# Patient Record
Sex: Female | Born: 1995 | Race: White | Hispanic: No | Marital: Single | Smoking: Never smoker
Health system: Southern US, Community
[De-identification: ages and names within clinical notes are randomized; demographics above are authoritative.]

## PROBLEM LIST (undated history)

## (undated) DIAGNOSIS — N809 Endometriosis, unspecified: Secondary | ICD-10-CM

## (undated) DIAGNOSIS — R87619 Unspecified abnormal cytological findings in specimens from cervix uteri: Secondary | ICD-10-CM

## (undated) DIAGNOSIS — K219 Gastro-esophageal reflux disease without esophagitis: Secondary | ICD-10-CM

## (undated) DIAGNOSIS — F32A Depression, unspecified: Secondary | ICD-10-CM

## (undated) DIAGNOSIS — F329 Major depressive disorder, single episode, unspecified: Secondary | ICD-10-CM

## (undated) HISTORY — PX: LAPAROSCOPY: SHX197

## (undated) HISTORY — DX: Major depressive disorder, single episode, unspecified: F32.9

## (undated) HISTORY — DX: Unspecified abnormal cytological findings in specimens from cervix uteri: R87.619

## (undated) HISTORY — DX: Depression, unspecified: F32.A

---

## 2014-02-10 ENCOUNTER — Emergency Department: Payer: Self-pay | Admitting: Emergency Medicine

## 2015-01-14 ENCOUNTER — Ambulatory Visit: Payer: Self-pay | Admitting: Family

## 2015-01-18 ENCOUNTER — Encounter: Payer: Self-pay | Admitting: Family Medicine

## 2015-01-18 DIAGNOSIS — F32A Depression, unspecified: Secondary | ICD-10-CM | POA: Insufficient documentation

## 2015-01-18 DIAGNOSIS — B349 Viral infection, unspecified: Principal | ICD-10-CM

## 2015-01-18 DIAGNOSIS — F329 Major depressive disorder, single episode, unspecified: Secondary | ICD-10-CM | POA: Insufficient documentation

## 2015-01-18 DIAGNOSIS — B2780 Other infectious mononucleosis without complication: Secondary | ICD-10-CM

## 2015-06-18 ENCOUNTER — Encounter: Payer: Self-pay | Admitting: *Deleted

## 2015-06-18 ENCOUNTER — Emergency Department: Payer: BLUE CROSS/BLUE SHIELD

## 2015-06-18 ENCOUNTER — Emergency Department
Admission: EM | Admit: 2015-06-18 | Discharge: 2015-06-18 | Disposition: A | Discharge status: Home / Self Care | Payer: BLUE CROSS/BLUE SHIELD | Attending: Emergency Medicine | Admitting: Emergency Medicine

## 2015-06-18 DIAGNOSIS — Z79899 Other long term (current) drug therapy: Secondary | ICD-10-CM | POA: Insufficient documentation

## 2015-06-18 DIAGNOSIS — Y9289 Other specified places as the place of occurrence of the external cause: Secondary | ICD-10-CM | POA: Insufficient documentation

## 2015-06-18 DIAGNOSIS — S0990XA Unspecified injury of head, initial encounter: Secondary | ICD-10-CM | POA: Diagnosis present

## 2015-06-18 DIAGNOSIS — W228XXA Striking against or struck by other objects, initial encounter: Secondary | ICD-10-CM | POA: Insufficient documentation

## 2015-06-18 DIAGNOSIS — S060X0A Concussion without loss of consciousness, initial encounter: Secondary | ICD-10-CM | POA: Diagnosis not present

## 2015-06-18 DIAGNOSIS — S0083XA Contusion of other part of head, initial encounter: Secondary | ICD-10-CM | POA: Insufficient documentation

## 2015-06-18 DIAGNOSIS — Y998 Other external cause status: Secondary | ICD-10-CM | POA: Insufficient documentation

## 2015-06-18 DIAGNOSIS — Z3202 Encounter for pregnancy test, result negative: Secondary | ICD-10-CM | POA: Insufficient documentation

## 2015-06-18 DIAGNOSIS — Y9389 Activity, other specified: Secondary | ICD-10-CM | POA: Insufficient documentation

## 2015-06-18 LAB — POCT PREGNANCY, URINE: Preg Test, Ur: NEGATIVE
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
## 2015-06-18 NOTE — Discharge Instructions (Signed)
Concussion A concussion, or closed-head injury, is a brain injury caused by a direct blow to the head or by a quick and sudden movement (jolt) of the head or neck. Concussions are usually not life-threatening. Even so, the effects of a concussion can be serious. If you have had a concussion before, you are more likely to experience concussion-like symptoms after a direct blow to the head.  CAUSES  Direct blow to the head, such as from running into another player during a soccer game, being hit in a fight, or hitting your head on a hard surface.  A jolt of the head or neck that causes the brain to move back and forth inside the skull, such as in a car crash. SIGNS AND SYMPTOMS The signs of a concussion can be hard to notice. Early on, they may be missed by you, family members, and health care providers. You may look fine but act or feel differently. Symptoms are usually temporary, but they may last for days, weeks, or even longer. Some symptoms may appear right away while others may not show up for hours or days. Every head injury is different. Symptoms include:  Mild to moderate headaches that will not go away.  A feeling of pressure inside your head.  Having more trouble than usual:  Learning or remembering things you have heard.  Answering questions.  Paying attention or concentrating.  Organizing daily tasks.  Making decisions and solving problems.  Slowness in thinking, acting or reacting, speaking, or reading.  Getting lost or being easily confused.  Feeling tired all the time or lacking energy (fatigued).  Feeling drowsy.  Sleep disturbances.  Sleeping more than usual.  Sleeping less than usual.  Trouble falling asleep.  Trouble sleeping (insomnia).  Loss of balance or feeling lightheaded or dizzy.  Nausea or vomiting.  Numbness or tingling.  Increased sensitivity to:  Sounds.  Lights.  Distractions.  Vision problems or eyes that tire  easily.  Diminished sense of taste or smell.  Ringing in the ears.  Mood changes such as feeling sad or anxious.  Becoming easily irritated or angry for little or no reason.  Lack of motivation.  Seeing or hearing things other people do not see or hear (hallucinations). DIAGNOSIS Your health care provider can usually diagnose a concussion based on a description of your injury and symptoms. He or she will ask whether you passed out (lost consciousness) and whether you are having trouble remembering events that happened right before and during your injury. Your evaluation might include:  A brain scan to look for signs of injury to the brain. Even if the test shows no injury, you may still have a concussion.  Blood tests to be sure other problems are not present. TREATMENT  Concussions are usually treated in an emergency department, in urgent care, or at a clinic. You may need to stay in the hospital overnight for further treatment.  Tell your health care provider if you are taking any medicines, including prescription medicines, over-the-counter medicines, and natural remedies. Some medicines, such as blood thinners (anticoagulants) and aspirin, may increase the chance of complications. Also tell your health care provider whether you have had alcohol or are taking illegal drugs. This information may affect treatment.  Your health care provider will send you home with important instructions to follow.  How fast you will recover from a concussion depends on many factors. These factors include how severe your concussion is, what part of your brain was injured, your  age, and how healthy you were before the concussion.  Most people with mild injuries recover fully. Recovery can take time. In general, recovery is slower in older persons. Also, persons who have had a concussion in the past or have other medical problems may find that it takes longer to recover from their current injury. HOME  CARE INSTRUCTIONS General Instructions  Carefully follow the directions your health care provider gave you.  Only take over-the-counter or prescription medicines for pain, discomfort, or fever as directed by your health care provider.  Take only those medicines that your health care provider has approved.  Do not drink alcohol until your health care provider says you are well enough to do so. Alcohol and certain other drugs may slow your recovery and can put you at risk of further injury.  If it is harder than usual to remember things, write them down.  If you are easily distracted, try to do one thing at a time. For example, do not try to watch TV while fixing dinner.  Talk with family members or close friends when making important decisions.  Keep all follow-up appointments. Repeated evaluation of your symptoms is recommended for your recovery.  Watch your symptoms and tell others to do the same. Complications sometimes occur after a concussion. Older adults with a brain injury may have a higher risk of serious complications, such as a blood clot on the brain.  Tell your teachers, school nurse, school counselor, coach, athletic trainer, or work Freight forwarder about your injury, symptoms, and restrictions. Tell them about what you can or cannot do. They should watch for:  Increased problems with attention or concentration.  Increased difficulty remembering or learning new information.  Increased time needed to complete tasks or assignments.  Increased irritability or decreased ability to cope with stress.  Increased symptoms.  Rest. Rest helps the brain to heal. Make sure you:  Get plenty of sleep at night. Avoid staying up late at night.  Keep the same bedtime hours on weekends and weekdays.  Rest during the day. Take daytime naps or rest breaks when you feel tired.  Limit activities that require a lot of thought or concentration. These include:  Doing homework or job-related  work.  Watching TV.  Working on the computer.  Avoid any situation where there is potential for another head injury (football, hockey, soccer, basketball, martial arts, downhill snow sports and horseback riding). Your condition will get worse every time you experience a concussion. You should avoid these activities until you are evaluated by the appropriate follow-up health care providers. Returning To Your Regular Activities You will need to return to your normal activities slowly, not all at once. You must give your body and brain enough time for recovery.  Do not return to sports or other athletic activities until your health care provider tells you it is safe to do so.  Ask your health care provider when you can drive, ride a bicycle, or operate heavy machinery. Your ability to react may be slower after a brain injury. Never do these activities if you are dizzy.  Ask your health care provider about when you can return to work or school. Preventing Another Concussion It is very important to avoid another brain injury, especially before you have recovered. In rare cases, another injury can lead to permanent brain damage, brain swelling, or death. The risk of this is greatest during the first 7-10 days after a head injury. Avoid injuries by:  Wearing a seat  belt when riding in a car.  Drinking alcohol only in moderation.  Wearing a helmet when biking, skiing, skateboarding, skating, or doing similar activities.  Avoiding activities that could lead to a second concussion, such as contact or recreational sports, until your health care provider says it is okay.  Taking safety measures in your home.  Remove clutter and tripping hazards from floors and stairways.  Use grab bars in bathrooms and handrails by stairs.  Place non-slip mats on floors and in bathtubs.  Improve lighting in dim areas. SEEK MEDICAL CARE IF:  You have increased problems paying attention or  concentrating.  You have increased difficulty remembering or learning new information.  You need more time to complete tasks or assignments than before.  You have increased irritability or decreased ability to cope with stress.  You have more symptoms than before. Seek medical care if you have any of the following symptoms for more than 2 weeks after your injury:  Lasting (chronic) headaches.  Dizziness or balance problems.  Nausea.  Vision problems.  Increased sensitivity to noise or light.  Depression or mood swings.  Anxiety or irritability.  Memory problems.  Difficulty concentrating or paying attention.  Sleep problems.  Feeling tired all the time. SEEK IMMEDIATE MEDICAL CARE IF:  You have severe or worsening headaches. These may be a sign of a blood clot in the brain.  You have weakness (even if only in one hand, leg, or part of the face).  You have numbness.  You have decreased coordination.  You vomit repeatedly.  You have increased sleepiness.  One pupil is larger than the other.  You have convulsions.  You have slurred speech.  You have increased confusion. This may be a sign of a blood clot in the brain.  You have increased restlessness, agitation, or irritability.  You are unable to recognize people or places.  You have neck pain.  It is difficult to wake you up.  You have unusual behavior changes.  You lose consciousness. MAKE SURE YOU:  Understand these instructions.  Will watch your condition.  Will get help right away if you are not doing well or get worse. Document Released: 11/28/2003 Document Revised: 09/12/2013 Document Reviewed: 03/30/2013 [facility] Patient Information 2015 [place], [place]. This information is not intended to replace advice given to you by your health care provider. Make sure you discuss any questions you have with your health care provider.  Concussion Direct trauma to the head often causes a condition  known as a concussion. This injury can temporarily interfere with brain function and may cause you to pass out (lose consciousness). The consequences of a concussion are usually short-term, but repetitive concussions can be very dangerous. If you have multiple concussions, you will have a greater risk of long-term effects, such as slurred speech, slow movements, impaired thinking, or tremors. The severity of a concussion is based on the length and severity of the interference with brain activity. SYMPTOMS  Symptoms of a concussion vary depending on the severity of the injury. Very mild concussions may even occur without any noticeable symptoms. Swelling in the area of the injury is not related to the seriousness of the injury.   Mild concussion:  Temporary loss of consciousness may or may not occur.  Memory loss (amnesia) for a short time.  Emotional instability.  Confusion.  Severe concussion:  Usually prolonged loss of consciousness.  Confusion  One pupil (the black part in the middle of the eye) is larger than  the other.  Changes in vision (including blurring).  Changes in breathing.  Disturbed balance (equilibrium).  Headaches.  Confusion.  Nausea or vomiting.  Slower reaction time than normal.  Difficulty learning and remembering things you have heard. CAUSES  A concussion is the result of trauma to the head. When the head is subjected to such an injury, the brain strikes against the inner wall of the skull. This impact is what causes the damage to the brain. The force of injury is related to severity of injury. The most severe concussions are associated with incidents that involve large impact forces such as motor vehicle accidents. Wearing a helmet will reduce the severity of trauma to the head, but concussions may still occur if you are wearing a helmet. RISK INCREASES WITH:  Contact sports (football, hockey, soccer, rugby, basketball or lacrosse).  Fighting sports  (martial arts or boxing).  Riding bicycles, motorcycles, or horses (when you ride without a helmet). PREVENTION  Wear proper protective headgear and ensure correct fit.  Wear seat belts when driving and riding in a car.  Do not drink or use mind-altering drugs and drive. PROGNOSIS  Concussions are typically curable if they are recognized and treated early. If a severe concussion or multiple concussions go untreated, then the complications may be life-threatening or cause permanent disability and brain damage. RELATED COMPLICATIONS   Permanent brain damage (slurred speech, slow movement, impaired thinking, or tremors).  Bleeding under the skull (subdural hemorrhage or hematoma, epidural hematoma).  Bleeding into the brain.  Prolonged healing time if usual activities are resumed too soon.  Infection if skin over the concussion site is broken.  Increased risk of future concussions (less trauma is required for a second concussion than the first). TREATMENT  Treatment initially requires immediate evaluation to determine the severity of the concussion. Occasionally, a hospital stay may be required for observation and treatment.  Avoid exertion. Bed rest for the first 24-48 hours is recommended.  Return to play is a controversial subject due to the increased risk for future injury as well as permanent disability and should be discussed at length with your treating caregiver. Many factors such as the severity of the concussion and whether this is the first, second, or third concussion play a role in timing a patient's return to sports.  MEDICATION  Do not give any medicine, including non-prescription acetaminophen or aspirin, until the diagnosis is certain. These medicines may mask developing symptoms.  SEEK IMMEDIATE MEDICAL CARE IF:   Symptoms get worse or do not improve in 24 hours.  Any of the following symptoms occur:  Vomiting.  The inability to move arms and legs equally well on  both sides.  Fever.  Neck stiffness.  Pupils of unequal size, shape, or reactivity.  Convulsions.  Noticeable restlessness.  Severe headache that persists for longer than 4 hours after injury.  Confusion, disorientation, or mental status changes. Document Released: 09/07/2005 Document Revised: 06/28/2013 Document Reviewed: 12/20/2008 [facility] Patient Information 2015 [place], [place]. This information is not intended to replace advice given to you by your health care provider. Make sure you discuss any questions you have with your health care provider.  Please return immediately if condition worsens. Please contact her primary physician or the physician you were given for referral. If you have any specialist physicians involved in her treatment and plan please also contact them. Thank you for using [place] [facility] regional emergency Department.

## 2015-10-25 ENCOUNTER — Encounter: Payer: Self-pay | Admitting: Emergency Medicine

## 2015-10-25 ENCOUNTER — Emergency Department
Admission: EM | Admit: 2015-10-25 | Discharge: 2015-10-25 | Disposition: A | Discharge status: Home / Self Care | Payer: BLUE CROSS/BLUE SHIELD | Attending: Emergency Medicine | Admitting: Emergency Medicine

## 2015-10-25 ENCOUNTER — Emergency Department: Payer: BLUE CROSS/BLUE SHIELD

## 2015-10-25 DIAGNOSIS — R1013 Epigastric pain: Secondary | ICD-10-CM | POA: Insufficient documentation

## 2015-10-25 DIAGNOSIS — R112 Nausea with vomiting, unspecified: Secondary | ICD-10-CM | POA: Diagnosis present

## 2015-10-25 DIAGNOSIS — Z880 Allergy status to penicillin: Secondary | ICD-10-CM | POA: Diagnosis not present

## 2015-10-25 DIAGNOSIS — R197 Diarrhea, unspecified: Secondary | ICD-10-CM | POA: Insufficient documentation

## 2015-10-25 DIAGNOSIS — R109 Unspecified abdominal pain: Secondary | ICD-10-CM

## 2015-10-25 DIAGNOSIS — Z3202 Encounter for pregnancy test, result negative: Secondary | ICD-10-CM | POA: Diagnosis not present

## 2015-10-25 DIAGNOSIS — Z79899 Other long term (current) drug therapy: Secondary | ICD-10-CM | POA: Diagnosis not present

## 2015-10-25 HISTORY — DX: Gastro-esophageal reflux disease without esophagitis: K21.9

## 2015-10-25 LAB — CBC WITH DIFFERENTIAL/PLATELET
BASOS ABS: 0 10*3/uL (ref 0–0.1)
BASOS PCT: 1 %
Eosinophils Absolute: 0.1 10*3/uL (ref 0–0.7)
Eosinophils Relative: 2 %
HEMATOCRIT: 40.5 % (ref 35.0–47.0)
HEMOGLOBIN: 13.8 g/dL (ref 12.0–16.0)
Lymphocytes Relative: 41 %
Lymphs Abs: 1.9 10*3/uL (ref 1.0–3.6)
MCH: 29.4 pg (ref 26.0–34.0)
MCHC: 34 g/dL (ref 32.0–36.0)
MCV: 86.6 fL (ref 80.0–100.0)
Monocytes Absolute: 0.4 10*3/uL (ref 0.2–0.9)
Monocytes Relative: 8 %
NEUTROS ABS: 2.3 10*3/uL (ref 1.4–6.5)
NEUTROS PCT: 48 %
Platelets: 266 10*3/uL (ref 150–440)
RBC: 4.67 MIL/uL (ref 3.80–5.20)
RDW: 12 % (ref 11.5–14.5)
WBC: 4.7 10*3/uL (ref 3.6–11.0)

## 2015-10-25 LAB — URINALYSIS COMPLETE WITH MICROSCOPIC (ARMC ONLY)
Bilirubin Urine: NEGATIVE
GLUCOSE, UA: NEGATIVE mg/dL
Hgb urine dipstick: NEGATIVE
Leukocytes, UA: NEGATIVE
NITRITE: NEGATIVE
PROTEIN: NEGATIVE mg/dL
SPECIFIC GRAVITY, URINE: 1.013 (ref 1.005–1.030)
pH: 6 (ref 5.0–8.0)

## 2015-10-25 LAB — HEPATIC FUNCTION PANEL
ALK PHOS: 35 U/L — AB (ref 38–126)
ALT: 11 U/L — AB (ref 14–54)
AST: 19 U/L (ref 15–41)
Albumin: 4.7 g/dL (ref 3.5–5.0)
BILIRUBIN DIRECT: 0.1 mg/dL (ref 0.1–0.5)
BILIRUBIN INDIRECT: 1 mg/dL — AB (ref 0.3–0.9)
BILIRUBIN TOTAL: 1.1 mg/dL (ref 0.3–1.2)
Total Protein: 7.7 g/dL (ref 6.5–8.1)

## 2015-10-25 LAB — POCT PREGNANCY, URINE: Preg Test, Ur: NEGATIVE

## 2015-10-25 LAB — BASIC METABOLIC PANEL
ANION GAP: 8 (ref 5–15)
BUN: 18 mg/dL (ref 6–20)
CALCIUM: 9.6 mg/dL (ref 8.9–10.3)
CO2: 25 mmol/L (ref 22–32)
Chloride: 105 mmol/L (ref 101–111)
Creatinine, Ser: 0.68 mg/dL (ref 0.44–1.00)
GFR calc non Af Amer: >60 mL/min (ref >60)
Glucose, Bld: 87 mg/dL (ref 65–99)
Potassium: 4.2 mmol/L (ref 3.5–5.1)
Sodium: 138 mmol/L (ref 135–145)

## 2015-10-25 LAB — LIPASE, BLOOD: Lipase: 24 U/L (ref 11–51)

## 2015-10-25 MED ORDER — ONDANSETRON 4 MG PO TBDP: 4.0000 mg | ORAL_TABLET | Freq: Three times a day (TID) | ORAL | Status: DC | PRN

## 2015-10-25 MED ORDER — PROMETHAZINE HCL 50 MG PO TABS: 25.0000 mg | ORAL_TABLET | Freq: Four times a day (QID) | ORAL | Status: DC | PRN

## 2015-10-25 MED ORDER — SODIUM CHLORIDE 0.9 % IV BOLUS (SEPSIS)
1000.0000 mL | Freq: Once | INTRAVENOUS | Status: AC
  Administered 2015-10-25: 1000 mL via INTRAVENOUS

## 2015-10-25 MED ORDER — SODIUM CHLORIDE 0.9 % IV BOLUS (SEPSIS): 1000.0000 mL | Freq: Once | INTRAVENOUS | Status: DC

## 2015-10-25 MED ORDER — GI COCKTAIL ~~LOC~~
30.0000 mL | Freq: Once | ORAL | Status: AC
  Administered 2015-10-25: 30 mL via ORAL
  Filled 2015-10-25: qty 30

## 2015-10-25 MED ORDER — FAMOTIDINE 20 MG PO TABS: 20.0000 mg | ORAL_TABLET | Freq: Two times a day (BID) | ORAL | Status: DC

## 2015-10-25 NOTE — ED Notes (Signed)
Pt able to ambulate independently and without signs of difficulty. Pt appears to be in no acute distress at time of d/c and verbalized complete understanding of d/c instructions.

## 2015-10-25 NOTE — ED Notes (Signed)
Pt states she has been unable to keep anything down for 4 days now, vomiting and severe reflux. States she was sent here from Dr office for rehydration.

## 2015-10-25 NOTE — Discharge Instructions (Signed)

## 2015-10-25 NOTE — ED Provider Notes (Addendum)
[facility] Emergency Department Provider Note  ____________________________________________   I have reviewed the triage vital signs and the nursing notes.   HISTORY  Chief Complaint Emesis; Dehydration; and Gastroesophageal Reflux    HPI Jeanne Merritt is a 20 y.o. female who presents today complaining of vomiting and diarrhea. Patient states her diarrhea is getting better but her vomiting persists. Patient has a history of significant acid indigestion. She denies any past surgical history, she denies any recent sexual activity that could make her pregnant. She is with a female partner she states. She denies any fever or chills. She has had epigastric abdominal pain for months she states which is not currently present but seems to get better when she takes Nexium however, she states, taking Nexium did not seem to go well with her Lexapro. She can expect a splint why this is. She has therefore switch to Zantac which seems to be helping. Patient does have an appointment with a GI doctor in a few days. She denies any hematemesis she denies any melena or bright red blood per rectum. She states her last vomiting was 2 days ago. She feels somewhat dehydrated.  Past Medical History  Diagnosis Date  . GERD (gastroesophageal reflux disease)     Patient Active Problem List   Diagnosis Date Noted  . Infectious mononucleosis-like syndrome 01/18/2015  . Depression 01/18/2015    History reviewed. No pertinent past surgical history.  Current Outpatient Rx  Name  Route  Sig  Dispense  Refill  . escitalopram (LEXAPRO) 5 MG tablet   Oral   Take 10 mg by mouth daily.           Allergies Amoxicillin  No family history on file.  Social History Social History  Substance Use Topics  . Smoking status: Never Smoker   . Smokeless tobacco: None  . Alcohol Use: Yes    Review of Systems Constitutional: No fever/chills Eyes: No visual changes. ENT: No  sore throat. No stiff neck no neck pain Cardiovascular: Denies chest pain. Respiratory: Denies shortness of breath. Gastrointestinal:   See history of present illness Genitourinary: Negative for dysuria. Musculoskeletal: Negative lower extremity swelling Skin: Negative for rash. Neurological: Negative for headaches, focal weakness or numbness. 10-point ROS otherwise negative.  ____________________________________________   PHYSICAL EXAM:  VITAL SIGNS: ED Triage Vitals  Enc Vitals Group     BP 10/25/15 0927 118/77 mmHg     Pulse Rate 10/25/15 0927 76     Resp 10/25/15 0927 18     Temp 10/25/15 0927 98.2 F (36.8 C)     Temp Source 10/25/15 0927 Oral     SpO2 10/25/15 0927 98 %     Weight 10/25/15 0927 104 lb (47.174 kg)     Height 10/25/15 0927 5\' 1"  (1.549 m)     Head Cir --      Peak Flow --      Pain Score 10/25/15 0928 6     Pain Loc --      Pain Edu? --      Excl. in [place]? --     Constitutional: Alert and oriented. Well appearing and in no acute distress., Very well-appearing Eyes: Conjunctivae are normal. PERRL. EOMI. Head: Atraumatic. Nose: No congestion/rhinnorhea. Mouth/Throat: Mucous membranes are moist.  Oropharynx non-erythematous. Neck: No stridor.   Nontender with no meningismus Cardiovascular: Normal rate, regular rhythm. Grossly normal heart sounds.  Good peripheral circulation. Respiratory: Normal respiratory effort.  No retractions. Lungs CTAB. Abdominal:  Soft and nontender. No distention. No guarding no rebound Back:  There is no focal tenderness or step off there is no midline tenderness there are no lesions noted. there is no CVA tenderness Musculoskeletal: No lower extremity tenderness. No joint effusions, no DVT signs strong distal pulses no edema Neurologic:  Normal speech and language. No gross focal neurologic deficits are appreciated.  Skin:  Skin is warm, dry and intact. No rash noted. Psychiatric: Mood and affect are normal. Speech and  behavior are normal.  ____________________________________________   LABS (all labs ordered are listed, but only abnormal results are displayed)  Labs Reviewed  CBC WITH DIFFERENTIAL/PLATELET  BASIC METABOLIC PANEL  LIPASE, BLOOD  URINALYSIS COMPLETEWITH MICROSCOPIC ([place])  POC URINE PREG, ED   ____________________________________________  EKG  I personally interpreted any EKGs ordered by me or triage  ____________________________________________  RADIOLOGY  I reviewed any imaging ordered by me or triage that were performed during my shift ____________________________________________   PROCEDURES  Procedure(s) performed: None  Critical Care performed: None  ____________________________________________   INITIAL IMPRESSION / ASSESSMENT AND PLAN / ED COURSE  Pertinent labs & imaging results that were available during my care of the patient were reviewed by me and considered in my medical decision making (see chart for details).  No evidence at this time of any acute intra-abdominal pathology requiring surgery or imaging. Abdomen is completely benign. We will check basic blood work and reassess. We'll give her IV fluids as she is not markedly dehydrated clinically she states that she has that she is and she was sent here for IV fluid. Vital signs are reassuring.  ----------------------------------------- 11:14 AM on 10/25/2015 ----------------------------------------- ' Discuss with her primary care physician. They're concerned because patient has been having epigastric abdominal pain for about a month now with vomiting episodes. No bleeding. I have explained to her that this is certainly a good candidate for outpatient GI follow-up but thus far we have not seen any abnormal illness which mandate admission. Abdomen is benign. Because of this recurrent symptoms, we will see if we can do light upper quadrant ultrasound to rule out gallbladder disease as that is,  barring endoscopy which I do not think is emergently indicated, the only other test I can do to help further this workup along assuming normal liver fraction tests which are pending.  ----------------------------------------- 12:23 PM on 10/25/2015 -----------------------------------------  Patient has had no nausea or vomiting here. No clinical or blood work abnormalities suggest that the patient is dehydrated. Specifically there is no evidence of ketosis or elevated creatinine despite symptoms off and on for several weeks. Patient does have close outpatient follow-up but I do think she is safe for discharge to follow up there. She is tolerating by mouth here with no difficulty [place] far. Serial abdominal exams are benign and as stated I did because of her concern obtain an ultrasound of the right upper quadrant which is as expected negative.  ----------------------------------------- 1:04 PM on 10/25/2015 -----------------------------------------  Discharge patient is laughing and joking with her friend in no acute distress abdomen remains benign. No vomiting or diarrhea here. ____________________________________________   FINAL CLINICAL IMPRESSION(S) / ED DIAGNOSES  Final diagnoses:  None      This chart was dictated using voice recognition software.  Despite best efforts to proofread,  errors can occur which can change meaning.     Schuyler Amor, MD 10/25/15 [place], MD 10/25/15 [place], MD 10/25/15 [place]  Leone Haven, MD 10/25/15 1304

## 2015-11-12 ENCOUNTER — Encounter: Payer: Self-pay | Admitting: Obstetrics and Gynecology

## 2015-11-20 ENCOUNTER — Other Ambulatory Visit: Payer: Self-pay | Admitting: Gastroenterology

## 2015-11-20 DIAGNOSIS — R1013 Epigastric pain: Secondary | ICD-10-CM

## 2015-11-21 ENCOUNTER — Other Ambulatory Visit: Payer: Self-pay | Admitting: Gastroenterology

## 2015-11-21 ENCOUNTER — Ambulatory Visit
Admission: RE | Admit: 2015-11-21 | Discharge: 2015-11-21 | Disposition: A | Discharge status: Home / Self Care | Payer: BLUE CROSS/BLUE SHIELD | Source: Ambulatory Visit | Attending: Gastroenterology | Admitting: Gastroenterology

## 2015-11-21 DIAGNOSIS — K219 Gastro-esophageal reflux disease without esophagitis: Secondary | ICD-10-CM | POA: Insufficient documentation

## 2015-11-21 DIAGNOSIS — R634 Abnormal weight loss: Secondary | ICD-10-CM

## 2015-11-21 DIAGNOSIS — K30 Functional dyspepsia: Secondary | ICD-10-CM | POA: Insufficient documentation

## 2015-11-21 DIAGNOSIS — R112 Nausea with vomiting, unspecified: Secondary | ICD-10-CM | POA: Insufficient documentation

## 2015-11-21 DIAGNOSIS — R1013 Epigastric pain: Secondary | ICD-10-CM

## 2015-11-21 MED ORDER — TECHNETIUM TC 99M SULFUR COLLOID
2.0000 | Freq: Once | INTRAVENOUS | Status: AC | PRN
  Administered 2015-11-21: 2.09 via INTRAVENOUS

## 2015-11-27 ENCOUNTER — Ambulatory Visit (INDEPENDENT_AMBULATORY_CARE_PROVIDER_SITE_OTHER): Payer: BLUE CROSS/BLUE SHIELD | Admitting: Obstetrics and Gynecology

## 2015-11-27 ENCOUNTER — Encounter: Payer: Self-pay | Admitting: Obstetrics and Gynecology

## 2015-11-27 VITALS — BP 121/81 | HR 79 | Ht 61.0 in | Wt 102.6 lb

## 2015-11-27 DIAGNOSIS — N926 Irregular menstruation, unspecified: Secondary | ICD-10-CM | POA: Diagnosis not present

## 2015-11-27 DIAGNOSIS — R102 Pelvic and perineal pain: Secondary | ICD-10-CM

## 2015-11-27 DIAGNOSIS — N939 Abnormal uterine and vaginal bleeding, unspecified: Secondary | ICD-10-CM | POA: Diagnosis not present

## 2015-11-27 LAB — POCT URINE PREGNANCY: PREG TEST UR: NEGATIVE

## 2015-11-27 NOTE — Patient Instructions (Signed)
1. Ultrasound will be scheduled for evaluation of pain and abnormal uterine bleeding 2. Return in 1 week after ultrasound for further management planning 3. Literature is given regarding endometriosis  Endometriosis Endometriosis is a condition in which the tissue that lines the uterus (endometrium) grows outside of its normal location. The tissue may grow in many locations close to the uterus, but it commonly grows on the ovaries, fallopian tubes, vagina, or bowel. Because the uterus expels, or sheds, its lining every menstrual cycle, there is bleeding wherever the endometrial tissue is located. This can cause pain because blood is irritating to tissues not normally exposed to it.  CAUSES  The cause of endometriosis is not known.  SIGNS AND SYMPTOMS  Often, there are no symptoms. When symptoms are present, they can vary with the location of the displaced tissue. Various symptoms can occur at different times. Although symptoms occur mainly during a woman's menstrual period, they can also occur midcycle and usually stop with menopause. Some people may go months with no symptoms at all. Symptoms may include:   Back or abdominal pain.   Heavier bleeding during periods.   Pain during intercourse.   Painful bowel movements.   Infertility. DIAGNOSIS  Your health care provider will do a physical exam and ask about your symptoms. Various tests may be done, such as:   Blood tests and urine tests. These are done to help rule out other problems.   Ultrasound. This test is done to look for abnormal tissue.   An X-ray of the lower bowel (barium enema).  Laparoscopy. In this procedure, a thin, lighted tube with a tiny camera on the end (laparoscope) is inserted into your abdomen. This helps your health care provider look for abnormal tissue to confirm the diagnosis. The health care provider may also remove a small piece of tissue (biopsy) from any abnormal tissue found. This tissue sample can  then be sent to a lab so it can be looked at under a microscope. TREATMENT  Treatment will vary and may include:   Medicines to relieve pain. Nonsteroidal anti-inflammatory drugs (NSAIDs) are a type of pain medicine that can help to relieve the pain caused by endometriosis.  Hormonal therapy. When using hormonal therapy, periods are eliminated. This eliminates the monthly exposure to blood by the displaced endometrial tissue.   Surgery. Surgery may sometimes be done to remove the abnormal endometrial tissue. In severe cases, surgery may be done to remove the fallopian tubes, uterus, and ovaries (hysterectomy). HOME CARE INSTRUCTIONS   Take all medicines as directed by your health care provider. Do not take aspirin because it may increase bleeding when you are not on hormonal therapy.   Avoid activities that produce pain, including sexual activity. SEEK MEDICAL CARE IF:  You have pelvic pain before, after, or during your periods.  You have pelvic pain between periods that gets worse during your period.  You have pelvic pain during or after sex.  You have pelvic pain with bowel movements or urination, especially during your period.  You have problems getting pregnant.  You have a fever. SEEK IMMEDIATE MEDICAL CARE IF:   Your pain is severe and is not responding to pain medicine.   You have severe nausea and vomiting, or you cannot keep foods down.   You have pain that is limited to the right lower part of your abdomen.   You have swelling or increasing pain in your abdomen.   You see blood in your stool.  MAKE  SURE YOU:   Understand these instructions.  Will watch your condition.  Will get help right away if you are not doing well or get worse.   This information is not intended to replace advice given to you by your health care provider. Make sure you discuss any questions you have with your health care provider.   Document Released: 09/04/2000 Document Revised:  09/28/2014 Document Reviewed: 05/05/2013 Elsevier Interactive Patient Education [employer].

## 2015-11-27 NOTE — Progress Notes (Signed)
GYN ENCOUNTER NOTE  Subjective:       Jeanne Merritt is a 20 y.o. G0P0000 female is here for gynecologic evaluation of the following issues:  1. Abnormal uterine bleeding.    20 year old single white female, para 0, in same sex relationship, using nothing for contraception, presents for evaluation of long history of abnormal uterine bleeding. Menarche was age 53.  First cycle was extremely heavy and prolonged upwards of 1 month duration; one week of birth control pills was given with subsequent switching to Depo-Provera injection; Depo-Provera suppress the bleeding.  She continued to take Depo-Provera for approximately 1 year, missed an injection, and developed recurrent heavy prolonged bleeding.  Patient subsequently went back on Depo-Provera continuously until August 2016 when she took her last injection.  Since August 2016.  Patient has had one episode of spotting.  She is experiencing cramps as if she is going to start her menses.  Patient has long history of anxiety/depression, and since coming off the Depo-Provera in August 2016, her psychology profile has improved regarding moods.  The patient is not desiring to continue with Depo-Provera at this time.    Gynecologic History Patient's last menstrual period was 11/14/2015 (approximate). Contraception: None. Same-sex relationship. Menarche age 29. History of dysmenorrhea. Pelvic pain characterized as low back pain and milder central cramping Patient has had intercourse without history of deep thrusting dyspareunia. Patient's mother had history of dysmenorrhea with subsequent hysterectomy; unknown.  History of endometriosis First Pap smear abnormal; follow-up Pap normal  Obstetric History OB History  Gravida Para Term Preterm AB SAB TAB Ectopic Multiple Living  0 0 0 0 0 0 0 0 0 0         Past Medical History  Diagnosis Date  . GERD (gastroesophageal reflux disease)   . Depression   . Abnormal Pap smear of cervix     x 1     History reviewed. No pertinent past surgical history.  Current Outpatient Prescriptions on File Prior to Visit  Medication Sig Dispense Refill  . escitalopram (LEXAPRO) 5 MG tablet Take 10 mg by mouth daily.    . famotidine (PEPCID) 20 MG tablet Take 1 tablet (20 mg total) by mouth 2 (two) times daily. 60 tablet 1  . Multiple Vitamin (MULTIVITAMIN WITH MINERALS) TABS tablet Take 1 tablet by mouth daily.    . promethazine (PHENERGAN) 50 MG tablet Take 0.5 tablets (25 mg total) by mouth every 6 (six) hours as needed for nausea or vomiting. 15 tablet 0  . ranitidine (ZANTAC) 150 MG tablet Take 150 mg by mouth as needed for heartburn.     No current facility-administered medications on file prior to visit.    Allergies  Allergen Reactions  . Amoxicillin Rash    Social History   Social History  . Marital Status: Single    Spouse Name: N/A  . Number of Children: N/A  . Years of Education: N/A   Occupational History  . Not on file.   Social History Main Topics  . Smoking status: Never Smoker   . Smokeless tobacco: Not on file  . Alcohol Use: Yes     Comment: weekly  . Drug Use: No  . Sexual Activity: Yes    Birth Control/ Protection: None     Comment: female partner   Other Topics Concern  . Not on file   Social History Narrative    Family History  Problem Relation Age of Onset  . Melanoma Maternal Grandmother   . Breast  cancer Paternal Grandmother   . Ovarian cancer Neg Hx   . Colon cancer Neg Hx   . Diabetes Neg Hx   . Heart disease Neg Hx     The following portions of the patient's history were reviewed and updated as appropriate: allergies, current medications, past family history, past medical history, past social history, past surgical history and problem list.  Review of Systems Review of Systems - General ROS: negative for - chills, fatigue, fever, hot flashes, malaise or night sweats Hematological and Lymphatic ROS: negative for - bleeding problems  or swollen lymph nodes Gastrointestinal ROS: negative for - abdominal pain, blood in stools, change in bowel habits and nausea/vomiting Musculoskeletal ROS: negative for - joint pain, muscle pain or muscular weakness Genito-Urinary ROS: negative for - change in menstrual cycle, dysmenorrhea, dyspareunia, dysuria, genital discharge, genital ulcers, hematuria, incontinence, irregular/heavy menses, nocturia or pelvic painjj  Objective:   BP 121/81 mmHg  Pulse 79  Ht 5\' 1"  (1.549 m)  Wt 102 lb 9.6 oz (46.539 kg)  BMI 19.40 kg/m2  LMP 11/14/2015 (Approximate) CONSTITUTIONAL: Well-developed, well-nourished female in no acute distress.  HENT:  Normocephalic, atraumatic.  NECK: Normal range of motion, supple, no masses.  Normal thyroid.  SKIN: Skin is warm and dry. No rash noted. Not diaphoretic. No erythema. No pallor. [place]: Alert and oriented to person, place, and time. PSYCHIATRIC: Normal mood and affect. Normal behavior. Normal judgment and thought content. CARDIOVASCULAR:Not Examined RESPIRATORY: Not Examined BREASTS: Not Examined ABDOMEN: Soft, non distended; Non tender.  No Organomegaly. PELVIC:(Intolerant of 2 digit exam, single digit exam performed)  External Genitalia: Normal  BUS: Normal  Vagina: Normal  Cervix: Normal; 1/4.  Cervical motion tenderness with left to right motion  Uterus: Normal size, shape,consistency, mobile, Nontender  Adnexa: Nonpalpable; left Adnexa, 2/4 tender; right Adnexa, nontender  RV: Normal External exam  Bladder: Nontender MUSCULOSKELETAL: Normal range of motion. No tenderness.  No cyanosis, clubbing, or edema.     Assessment:   1. Irregular menses; No history of cyclic menses; long history of Depo-Provera use - POCT urine pregnancy - US Pelvis Complete; Future - US Transvaginal Non-OB; Future  2. Pelvic pain in female; Severe dysmenorrhea and pain during bleeding - US Pelvis Complete; Future - US Transvaginal Non-OB; Future  3.  Abnormal uterine bleeding (AUB) New onset bleeding 6 months after last Depo-Provera injection   4.  Anxiety/depression with mood improvement following discontinuation of Depo-Provera   Plan:   1.  Pelvic ultrasound. 2.  Return in 1 week for further management planning 3.  Consider Mirena IUD or birth control pills for hormonal regulation of menses versus observation 4.  Possible consideration of laparoscopy to assess for endometriosis, which can explain abnormal uterine bleeding as well as dysmenorrhea  A total of 30 minutes were spent face-to-face with the patient during the encounter with greater than 50% dealing with counseling and coordination of care.  Brayton Mars, MD  Note: This dictation was prepared with Dragon dictation along with smaller phrase technology. Any transcriptional errors that result from this process are unintentional.

## 2015-11-29 ENCOUNTER — Ambulatory Visit (INDEPENDENT_AMBULATORY_CARE_PROVIDER_SITE_OTHER): Payer: BLUE CROSS/BLUE SHIELD

## 2015-11-29 DIAGNOSIS — N926 Irregular menstruation, unspecified: Secondary | ICD-10-CM | POA: Diagnosis not present

## 2015-11-29 DIAGNOSIS — R102 Pelvic and perineal pain: Secondary | ICD-10-CM | POA: Diagnosis not present

## 2015-12-12 ENCOUNTER — Ambulatory Visit: Payer: BLUE CROSS/BLUE SHIELD | Admitting: Obstetrics and Gynecology

## 2015-12-18 ENCOUNTER — Ambulatory Visit (INDEPENDENT_AMBULATORY_CARE_PROVIDER_SITE_OTHER): Payer: BLUE CROSS/BLUE SHIELD | Admitting: Obstetrics and Gynecology

## 2015-12-18 ENCOUNTER — Encounter: Payer: Self-pay | Admitting: Obstetrics and Gynecology

## 2015-12-18 VITALS — BP 110/79 | HR 112 | Ht 61.0 in | Wt 105.1 lb

## 2015-12-18 DIAGNOSIS — N926 Irregular menstruation, unspecified: Secondary | ICD-10-CM | POA: Diagnosis not present

## 2015-12-18 DIAGNOSIS — N840 Polyp of corpus uteri: Secondary | ICD-10-CM | POA: Diagnosis not present

## 2015-12-18 DIAGNOSIS — N939 Abnormal uterine and vaginal bleeding, unspecified: Secondary | ICD-10-CM | POA: Insufficient documentation

## 2015-12-18 NOTE — Progress Notes (Signed)
Chief complaint: 1. Abnormal uterine bleeding 2. Follow-up on pelvic ultrasound  Patient presents for follow-up. Recent ultrasound demonstrates possible endometrial polyp within the endometrial cavity. Ovaries demonstrates features possibly consistent with PCO  Findings were reviewed. Abnormal uterine bleeding may be related to prolonged Depo-Provera effect, versus possible endometrial polyp or submucosal fibroid, versus possible polycystic ovary disease. Recommended treatment is combined oral contraceptives for cycle regulation and management of possible PCO. Such treatment may exacerbate patient's clinical depression. Patient is not willing to take the risk of combined oral contraceptive therapy at this time. Because the patient's bleeding is occurring on an every other day basis, alternative management is to perform ENDOSEE procedure; if polyp is identified, and proceed with hysteroscopy/D&C with polypectomy. Patient is in agreement with this management and will be scheduled for the office procedure.  ASSESSMENT: 1. Abnormal uterine bleeding 2. Possible endometrial polyp on ultrasound 3. OCP therapy management is declined due to possible impact on clinical depression  PLAN: 1. Return for ENDOSEE procedure in the office  A total of 15 minutes were spent face-to-face with the patient during this encounter and over half of that time dealt with counseling and coordination of care.  Brayton Mars, MD  Note: This dictation was prepared with Dragon dictation along with smaller phrase technology. Any transcriptional errors that result from this process are unintentional.

## 2015-12-18 NOTE — Patient Instructions (Signed)
1. In office procedure will be set up for 1-2 weeks from the-ENDOSEE 2. Take 600 mg of Advil before the procedure

## 2015-12-30 ENCOUNTER — Ambulatory Visit (INDEPENDENT_AMBULATORY_CARE_PROVIDER_SITE_OTHER): Payer: BLUE CROSS/BLUE SHIELD | Admitting: Obstetrics and Gynecology

## 2015-12-30 ENCOUNTER — Encounter: Payer: Self-pay | Admitting: Obstetrics and Gynecology

## 2015-12-30 VITALS — BP 115/83 | HR 94 | Ht 61.0 in | Wt 105.6 lb

## 2015-12-30 DIAGNOSIS — D250 Submucous leiomyoma of uterus: Secondary | ICD-10-CM

## 2015-12-30 DIAGNOSIS — N939 Abnormal uterine and vaginal bleeding, unspecified: Secondary | ICD-10-CM | POA: Diagnosis not present

## 2015-12-30 NOTE — Patient Instructions (Signed)
Uterine Fibroids Uterine fibroids are tissue masses (tumors) that can develop in the womb (uterus). They are also called leiomyomas. This type of tumor is not cancerous (benign) and does not spread to other parts of the body outside of the pelvic area, which is between the hip bones. Occasionally, fibroids may develop in the fallopian tubes, in the cervix, or on the support structures (ligaments) that surround the uterus. You can have one or many fibroids. Fibroids can vary in size, weight, and where they grow in the uterus. Some can become quite large. Most fibroids do not require medical treatment. CAUSES A fibroid can develop when a single uterine cell keeps growing (replicating). Most cells in the human body have a control mechanism that keeps them from replicating without control. SIGNS AND SYMPTOMS Symptoms may include:   Heavy bleeding during your period.  Bleeding or spotting between periods.  Pelvic pain and pressure.  Bladder problems, such as needing to urinate more often (urinary frequency) or urgently.  Inability to reproduce offspring (infertility).  Miscarriages. DIAGNOSIS Uterine fibroids are diagnosed through a physical exam. Your health care provider may feel the lumpy tumors during a pelvic exam. Ultrasonography and an MRI may be done to determine the size, location, and number of fibroids. TREATMENT Treatment may include:  Watchful waiting. This involves getting the fibroid checked by your health care provider to see if it grows or shrinks. Follow your health care provider's recommendations for how often to have this checked.  Hormone medicines. These can be taken by mouth or given through an intrauterine device (IUD).  Surgery.  Removing the fibroids (myomectomy) or the uterus (hysterectomy).  Removing blood supply to the fibroids (uterine artery embolization). If fibroids interfere with your fertility and you want to become pregnant, your health care provider  may recommend having the fibroids removed.  HOME CARE INSTRUCTIONS  Keep all follow-up visits as directed by your health care provider. This is important.  Take medicines only as directed by your health care provider.  If you were prescribed a hormone treatment, take the hormone medicines exactly as directed.  Do not take aspirin, because it can cause bleeding.  Ask your health care provider about taking iron pills and increasing the amount of dark green, leafy vegetables in your diet. These actions can help to boost your blood iron levels, which may be affected by heavy menstrual bleeding.  Pay close attention to your period and tell your health care provider about any changes, such as:  Increased blood flow that requires you to use more pads or tampons than usual per month.  A change in the number of days that your period lasts per month.  A change in symptoms that are associated with your period, such as abdominal cramping or back pain. SEEK MEDICAL CARE IF:  You have pelvic pain, back pain, or abdominal cramps that cannot be controlled with medicines.  You have an increase in bleeding between and during periods.  You soak tampons or pads in a half hour or less.  You feel lightheaded, extra tired, or weak. SEEK IMMEDIATE MEDICAL CARE IF:  You faint.  You have a sudden increase in pelvic pain.   This information is not intended to replace advice given to you by your health care provider. Make sure you discuss any questions you have with your health care provider.   Document Released: 09/04/2000 Document Revised: 09/28/2014 Document Reviewed: 03/06/2014 Elsevier Interactive Patient Education 2016 [employer]  

## 2015-12-31 NOTE — Progress Notes (Signed)
Procedure note: ENDOSEE Indications: Abnormal uterine bleeding; hypervascular endometrium suspicious for possible polyps/fibroids noted on ultrasound Description: Patient is placed in the dorsal lithotomy position. Bimanual exam demonstrates midplane uterus of normal size and shape. Pearson speculum was placed to expose the cervix. The cervix is cleansed with Betadine. Under sterile technique the ENDOSEE instrument is placed through the endocervical canal; saline fluid is instilled through a 50 cc syringe per protocol. A posterior submucosal fibroid appears present in the mid and lower uterine segment. No other abnormal lesions are seen. Following photo documentation, the procedure is completed and ENDOSEE instrument was removed. Procedure was well-tolerated. Blood loss is minimal.  ASSESSMENT: 1. Abnormal uterine bleeding 2. Suspected submucosal fibroid on ENDOSEE procedure 3. History of clinical depression, possibly made worse with hormones (Depo-Provera) 4. Possible PCO 5. No need for endometrial sampling at this time  PLAN: 1. Menstrual calendar monitoring of bleeding 2. Return in 3 months for follow-up. If bleeding continues, may consider endometrial biopsy at that time  Brayton Mars, MD

## 2016-01-01 DIAGNOSIS — D250 Submucous leiomyoma of uterus: Secondary | ICD-10-CM | POA: Insufficient documentation

## 2016-01-15 NOTE — Addendum Note (Signed)
Addended by: Elouise Munroe on: 01/15/2016 01:40 PM   Modules accepted: Orders

## 2016-05-19 ENCOUNTER — Ambulatory Visit: Payer: BLUE CROSS/BLUE SHIELD | Admitting: Obstetrics and Gynecology

## 2016-07-01 ENCOUNTER — Emergency Department: Payer: BLUE CROSS/BLUE SHIELD

## 2016-07-01 ENCOUNTER — Emergency Department
Admission: EM | Admit: 2016-07-01 | Discharge: 2016-07-01 | Disposition: A | Discharge status: Home / Self Care | Payer: BLUE CROSS/BLUE SHIELD | Attending: Emergency Medicine | Admitting: Emergency Medicine

## 2016-07-01 DIAGNOSIS — G43909 Migraine, unspecified, not intractable, without status migrainosus: Secondary | ICD-10-CM | POA: Insufficient documentation

## 2016-07-01 LAB — CBC
HEMATOCRIT: 35.1 % (ref 35.0–47.0)
HEMOGLOBIN: 12.3 g/dL (ref 12.0–16.0)
MCH: 31 pg (ref 26.0–34.0)
MCHC: 35 g/dL (ref 32.0–36.0)
MCV: 88.5 fL (ref 80.0–100.0)
Platelets: 274 10*3/uL (ref 150–440)
RBC: 3.97 MIL/uL (ref 3.80–5.20)
RDW: 11.9 % (ref 11.5–14.5)
WBC: 5.7 10*3/uL (ref 3.6–11.0)

## 2016-07-01 LAB — BASIC METABOLIC PANEL
Anion gap: 7 (ref 5–15)
BUN: 14 mg/dL (ref 6–20)
CHLORIDE: 108 mmol/L (ref 101–111)
CO2: 23 mmol/L (ref 22–32)
Calcium: 8.7 mg/dL — ABNORMAL LOW (ref 8.9–10.3)
Creatinine, Ser: 0.65 mg/dL (ref 0.44–1.00)
GFR calc Af Amer: >60 mL/min (ref >60)
GFR calc non Af Amer: >60 mL/min (ref >60)
GLUCOSE: 111 mg/dL — AB (ref 65–99)
POTASSIUM: 3.6 mmol/L (ref 3.5–5.1)
Sodium: 138 mmol/L (ref 135–145)

## 2016-07-01 LAB — TROPONIN I: Troponin I: <0.03 ng/mL (ref <0.03)

## 2016-07-01 MED ORDER — KETOROLAC TROMETHAMINE 30 MG/ML IJ SOLN
30.0000 mg | Freq: Once | INTRAMUSCULAR | Status: AC
  Administered 2016-07-01: 30 mg via INTRAVENOUS
  Filled 2016-07-01: qty 1

## 2016-07-01 MED ORDER — METOCLOPRAMIDE HCL 5 MG/ML IJ SOLN
10.0000 mg | Freq: Once | INTRAMUSCULAR | Status: AC
  Administered 2016-07-01: 10 mg via INTRAVENOUS
  Filled 2016-07-01: qty 2

## 2016-07-01 MED ORDER — DIPHENHYDRAMINE HCL 50 MG/ML IJ SOLN
25.0000 mg | Freq: Once | INTRAMUSCULAR | Status: AC
  Administered 2016-07-01: 25 mg via INTRAVENOUS
  Filled 2016-07-01: qty 1

## 2016-07-01 MED ORDER — BUTALBITAL-APAP-CAFFEINE 50-325-40 MG PO TABS: 1.0000 | ORAL_TABLET | Freq: Four times a day (QID) | ORAL | 0 refills | Status: AC | PRN

## 2016-07-01 MED ORDER — SODIUM CHLORIDE 0.9 % IV BOLUS (SEPSIS)
1000.0000 mL | Freq: Once | INTRAVENOUS | Status: AC
  Administered 2016-07-01: 1000 mL via INTRAVENOUS

## 2016-07-01 NOTE — ED Notes (Signed)
Patient to ED for right sided headache. States she has a right sided migraine, history of having them in the past, that woke her up at midnight and was accompanied by vomiting. States she started her period about three or four days ago but headache is not usually associated with hormones.

## 2016-07-01 NOTE — ED Notes (Signed)
Patient tolerated IV insertion and medications well. Will continue monitor. Patient oriented to use of call bell.

## 2016-07-01 NOTE — ED Triage Notes (Signed)
Patient ambulatory to triage with steady gait, without difficulty or distress noted; pt reports right sided HA tonight accomp by N/V; st hx of same

## 2016-07-01 NOTE — ED Provider Notes (Signed)
[facility] Emergency Department Provider Note   ____________________________________________   First MD Initiated Contact with Patient 07/01/16 0301     (approximate)  I have reviewed the triage vital signs and the nursing notes.   HISTORY  Chief Complaint Migraine    HPI Jeanne Merritt is a 20 y.o. female who comes into the hospital today with a bad migraine, chest pain and vomiting. The patient reports she has had migraines before but this one is more intense. The patient took some Percocet and Flexeril because she thought it was due to muscle tension but she reports that the pain just got worse. She had some improvement with the Percocet but she still rates her pain 8 out of 10 in intensity. She reports that the pain is on the right side of her head and in the front of her head and behind her eye. She reports that the symptoms started at midnight. She woke up with the headache, vomiting and the chest discomfort. She is sensitive to light and sound. The patient reports that her chest feels tight and his little worse on the right versus the left. The patient reports she is on her period has not been eating very well as well. The patient is here for evaluation.   Past Medical History:  Diagnosis Date  . Abnormal Pap smear of cervix    x 1  . Depression   . GERD (gastroesophageal reflux disease)     Patient Active Problem List   Diagnosis Date Noted  . Fibroids, submucosal 01/01/2016  . Abnormal uterine bleeding 12/18/2015  . Abnormal uterine bleeding (AUB) 12/18/2015  . Infectious mononucleosis-like syndrome 01/18/2015  . Depression 01/18/2015    No past surgical history on file.  Prior to Admission medications   Medication Sig Start Date End Date Taking? Authorizing Provider  butalbital-acetaminophen-caffeine (FIORICET, ESGIC) 50-325-40 MG tablet Take 1-2 tablets by mouth every 6 (six) hours as needed for headache. 07/01/16 07/01/17  Loney Hering, MD  calcium elemental as carbonate (PX ANTACID MAXIMUM STRENGTH) 400 MG chewable tablet Chew by mouth.    Historical Provider, MD  Chlordiazepoxide-Clidinium (LIBRAX PO) Take by mouth.    Historical Provider, MD  escitalopram (LEXAPRO) 5 MG tablet Take 10 mg by mouth daily.    Historical Provider, MD  Multiple Vitamin (MULTIVITAMIN WITH MINERALS) TABS tablet Take 1 tablet by mouth daily.    Historical Provider, MD  pantoprazole (PROTONIX) 40 MG tablet Take by mouth. 10/29/15   Historical Provider, MD    Allergies Amoxicillin  Family History  Problem Relation Age of Onset  . Melanoma Maternal Grandmother   . Breast cancer Paternal Grandmother   . Ovarian cancer Neg Hx   . Colon cancer Neg Hx   . Diabetes Neg Hx   . Heart disease Neg Hx     Social History Social History  Substance Use Topics  . Smoking status: Never Smoker  . Smokeless tobacco: Not on file  . Alcohol use Yes     Comment: weekly    Review of Systems Constitutional: No fever/chills Eyes: No visual changes. ENT: No sore throat. Cardiovascular:  chest pain. Respiratory: Denies shortness of breath. Gastrointestinal: No abdominal pain.  No nausea, no vomiting.  No diarrhea.  No constipation. Genitourinary: Negative for dysuria. Musculoskeletal: Negative for back pain. Skin: Negative for rash. Neurological: Headache  10-point ROS otherwise negative.  ____________________________________________   PHYSICAL EXAM:  VITAL SIGNS: ED Triage Vitals  Enc Vitals Group  BP 07/01/16 0252 124/77     Pulse Rate 07/01/16 0252 88     Resp 07/01/16 0252 18     Temp 07/01/16 0252 97.5 F (36.4 C)     Temp Source 07/01/16 0252 Oral     SpO2 07/01/16 0252 97 %     Weight 07/01/16 0249 105 lb (47.6 kg)     Height 07/01/16 0249 5\' 1"  (1.549 m)     Head Circumference --      Peak Flow --      Pain Score 07/01/16 0249 8     Pain Loc --      Pain Edu? --      Excl. in [place]? --     Constitutional: Alert  and oriented. Well appearing and in no acute distress. Eyes: Conjunctivae are normal. PERRL. EOMI. Head: Atraumatic. Nose: No congestion/rhinnorhea. Mouth/Throat: Mucous membranes are moist.  Oropharynx non-erythematous. ardiovascular: Normal rate, regular rhythm. Grossly normal heart sounds.  Good peripheral circulation. Respiratory: Normal respiratory effort.  No retractions. Lungs CTAB. Gastrointestinal: Soft and nontender. No distention. Positive bowel sounds Musculoskeletal: No lower extremity tenderness nor edema.   Neurologic:  Normal speech and language. Cranial nerves II through XII are grossly intact with no focal motor or neuro deficits Skin:  Skin is warm, dry and intact.  Psychiatric: Mood and affect are normal.   ____________________________________________   LABS (all labs ordered are listed, but only abnormal results are displayed)  Labs Reviewed  BASIC METABOLIC PANEL - Abnormal; Notable for the following:       Result Value   Glucose, Bld 111 (*)    Calcium 8.7 (*)    All other components within normal limits  CBC  TROPONIN I   ____________________________________________  EKG  ED ECG REPORT I, Loney Hering, the attending physician, personally viewed and interpreted this ECG.   Date: 07/01/2016  EKG Time: 533  Rate: 68  Rhythm: normal sinus rhythm  Axis: normal  Intervals:none  ST&T Change: none  ____________________________________________  RADIOLOGY  CT head Chest x-ray ____________________________________________   PROCEDURES  Procedure(s) performed: None  Procedures  Critical Care performed: No  ____________________________________________   INITIAL IMPRESSION / ASSESSMENT AND PLAN / ED COURSE  Pertinent labs & imaging results that were available during my care of the patient were reviewed by me and considered in my medical decision making (see chart for details).  This is a 20 year old female who comes into the hospital  today with a headache, chest pain and vomiting. The patient has had migraines but reports it has not been this bad. I will give the patient some Reglan, Benadryl, Toradol and a liter of normal saline. I will send her for some imaging and I will reassess the patient. I will also check some blood work.  Clinical Course  Value Comment By Time  CT Head Wo Contrast Normal noncontrast head CT Loney Hering, MD 10/11 [phone]  DG Chest 2 View No acute cardiopulmonary process seen. Loney Hering, MD 10/11 [phone]    The patient's blood work, EKG and imaging are all unremarkable. After medication the patient feels much improved. She'll be discharged home to follow-up with her primary care physician and neurologist. ____________________________________________   FINAL CLINICAL IMPRESSION(S) / ED DIAGNOSES  Final diagnoses:  Migraine without status migrainosus, not intractable, unspecified migraine type      NEW MEDICATIONS STARTED DURING THIS VISIT:  New Prescriptions   BUTALBITAL-ACETAMINOPHEN-CAFFEINE (FIORICET, ESGIC) 50-325-40 MG TABLET    Take 1-2  tablets by mouth every 6 (six) hours as needed for headache.     Note:  This document was prepared using Dragon voice recognition software and may include unintentional dictation errors.    Loney Hering, MD 07/01/16 [phone]

## 2016-07-01 NOTE — ED Notes (Signed)
Patient to CT.

## 2016-07-01 NOTE — ED Notes (Signed)
Patient sleeping soundly; arouses to movement in the room. Lights out for comfort. Patient states she is feeling better.

## 2016-10-23 ENCOUNTER — Encounter: Payer: Self-pay | Admitting: Emergency Medicine

## 2016-10-23 ENCOUNTER — Emergency Department
Admission: EM | Admit: 2016-10-23 | Discharge: 2016-10-23 | Disposition: A | Discharge status: Home / Self Care | Payer: BLUE CROSS/BLUE SHIELD | Attending: Emergency Medicine | Admitting: Emergency Medicine

## 2016-10-23 DIAGNOSIS — Z79899 Other long term (current) drug therapy: Secondary | ICD-10-CM | POA: Diagnosis not present

## 2016-10-23 DIAGNOSIS — F410 Panic disorder [episodic paroxysmal anxiety] without agoraphobia: Secondary | ICD-10-CM | POA: Insufficient documentation

## 2016-10-23 DIAGNOSIS — S40811A Abrasion of right upper arm, initial encounter: Secondary | ICD-10-CM | POA: Insufficient documentation

## 2016-10-23 DIAGNOSIS — F329 Major depressive disorder, single episode, unspecified: Secondary | ICD-10-CM | POA: Diagnosis not present

## 2016-10-23 DIAGNOSIS — Y939 Activity, unspecified: Secondary | ICD-10-CM | POA: Diagnosis not present

## 2016-10-23 DIAGNOSIS — Y999 Unspecified external cause status: Secondary | ICD-10-CM | POA: Insufficient documentation

## 2016-10-23 DIAGNOSIS — Y929 Unspecified place or not applicable: Secondary | ICD-10-CM | POA: Insufficient documentation

## 2016-10-23 DIAGNOSIS — F4323 Adjustment disorder with mixed anxiety and depressed mood: Secondary | ICD-10-CM

## 2016-10-23 DIAGNOSIS — N809 Endometriosis, unspecified: Secondary | ICD-10-CM

## 2016-10-23 DIAGNOSIS — S4991XA Unspecified injury of right shoulder and upper arm, initial encounter: Secondary | ICD-10-CM | POA: Diagnosis present

## 2016-10-23 DIAGNOSIS — X58XXXA Exposure to other specified factors, initial encounter: Secondary | ICD-10-CM | POA: Insufficient documentation

## 2016-10-23 HISTORY — DX: Endometriosis, unspecified: N80.9

## 2016-10-23 LAB — CBC
HEMATOCRIT: 37 % (ref 35.0–47.0)
Hemoglobin: 13 g/dL (ref 12.0–16.0)
MCH: 29.8 pg (ref 26.0–34.0)
MCHC: 35.1 g/dL (ref 32.0–36.0)
MCV: 84.8 fL (ref 80.0–100.0)
Platelets: 303 10*3/uL (ref 150–440)
RBC: 4.37 MIL/uL (ref 3.80–5.20)
RDW: 12.5 % (ref 11.5–14.5)
WBC: 7.2 10*3/uL (ref 3.6–11.0)

## 2016-10-23 LAB — COMPREHENSIVE METABOLIC PANEL
ALBUMIN: 4.9 g/dL (ref 3.5–5.0)
ALT: 18 U/L (ref 14–54)
ANION GAP: 8 (ref 5–15)
AST: 24 U/L (ref 15–41)
Alkaline Phosphatase: 32 U/L — ABNORMAL LOW (ref 38–126)
BILIRUBIN TOTAL: 0.5 mg/dL (ref 0.3–1.2)
BUN: 13 mg/dL (ref 6–20)
CHLORIDE: 103 mmol/L (ref 101–111)
CO2: 24 mmol/L (ref 22–32)
Calcium: 9.2 mg/dL (ref 8.9–10.3)
Creatinine, Ser: 0.69 mg/dL (ref 0.44–1.00)
GFR calc Af Amer: >60 mL/min (ref >60)
GFR calc non Af Amer: >60 mL/min (ref >60)
GLUCOSE: 87 mg/dL (ref 65–99)
POTASSIUM: 3.6 mmol/L (ref 3.5–5.1)
SODIUM: 135 mmol/L (ref 135–145)
TOTAL PROTEIN: 8.3 g/dL — AB (ref 6.5–8.1)

## 2016-10-23 LAB — URINE DRUG SCREEN, QUALITATIVE (ARMC ONLY)
AMPHETAMINES, UR SCREEN: NOT DETECTED
BENZODIAZEPINE, UR SCRN: POSITIVE — AB
Barbiturates, Ur Screen: NOT DETECTED
Cannabinoid 50 Ng, Ur ~~LOC~~: NOT DETECTED
Cocaine Metabolite,Ur ~~LOC~~: NOT DETECTED
MDMA (ECSTASY) UR SCREEN: NOT DETECTED
METHADONE SCREEN, URINE: NOT DETECTED
OPIATE, UR SCREEN: NOT DETECTED
Phencyclidine (PCP) Ur S: NOT DETECTED
Tricyclic, Ur Screen: NOT DETECTED

## 2016-10-23 LAB — ETHANOL: Alcohol, Ethyl (B): <5 mg/dL (ref <5)

## 2016-10-23 LAB — ACETAMINOPHEN LEVEL

## 2016-10-23 LAB — SALICYLATE LEVEL: Salicylate Lvl: <7 mg/dL (ref 2.8–30.0)

## 2016-10-23 LAB — POCT PREGNANCY, URINE: PREG TEST UR: NEGATIVE

## 2016-10-23 MED ORDER — DIAZEPAM 5 MG PO TABS
5.0000 mg | ORAL_TABLET | Freq: Once | ORAL | Status: AC
  Administered 2016-10-23: 5 mg via ORAL

## 2016-10-23 MED ORDER — DIAZEPAM 5 MG PO TABS
ORAL_TABLET | ORAL | Status: AC
  Filled 2016-10-23: qty 1

## 2016-10-23 NOTE — ED Notes (Signed)

## 2016-10-23 NOTE — ED Notes (Signed)
Patient dressed out by this RN and Erline Levine, Therapist, sports.   Patient Jeanne Merritt given to, Revonda Humphrey. Patient's clothing placed in patient belongings bags.

## 2016-10-23 NOTE — ED Notes (Signed)
Urine pregnancy test negative
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
## 2016-10-23 NOTE — ED Notes (Signed)
Attempted to call pts phone with contact provided without answer, was going to check on pt and attempt to get her to return to the ED.

## 2016-10-23 NOTE — ED Notes (Signed)
Called for pt to be triaged without answer.

## 2016-10-23 NOTE — ED Triage Notes (Signed)
Patient present to ED for evaluation for suicidal ideation. Patient denies currently having suicidal thoughts. Patient states that she went to her therapist today to discuss feelings of depression and they recommended that patient come to the ED to be evaluated.

## 2016-10-23 NOTE — ED Provider Notes (Addendum)
[facility] Emergency Department Provider Note        Time seen: ----------------------------------------- 5:49 PM on 10/23/2016 -----------------------------------------    I have reviewed the triage vital signs and the nursing notes.   HISTORY  Chief Complaint suicidal ideation    HPI Jeanne Merritt is a 21 y.o. female who presents to the ER for suicidal ideation. Patient reports she had a panic attack earlier. She had taken 1 mg of Ativan without significant improvement in her anxiety. She is currently having suicidal thoughts. She went to her therapist today to discuss feelings of depression and they recommended that she come to the ER for evaluation.   Past Medical History:  Diagnosis Date  . Abnormal Pap smear of cervix    x 1  . Depression   . Endometriosis   . GERD (gastroesophageal reflux disease)     Patient Active Problem List   Diagnosis Date Noted  . Fibroids, submucosal 01/01/2016  . Abnormal uterine bleeding 12/18/2015  . Abnormal uterine bleeding (AUB) 12/18/2015  . Infectious mononucleosis-like syndrome 01/18/2015  . Depression 01/18/2015    Past Surgical History:  Procedure Laterality Date  . LAPAROSCOPY      Allergies Amoxicillin  Social History Social History  Substance Use Topics  . Smoking status: Never Smoker  . Smokeless tobacco: Never Used  . Alcohol use Yes     Comment: occ    Review of Systems Constitutional: Negative for fever. Cardiovascular: Negative for chest pain. Respiratory: Negative for shortness of breath. Gastrointestinal: Negative for abdominal pain, vomiting and diarrhea. Genitourinary: Negative for dysuria. Musculoskeletal: Negative for back pain. Skin: Positive for abrasion on her right arm Neurological: Negative for headaches, focal weakness or numbness. Psychiatric: Positive for anxiety, suicidal ideation  10-point ROS otherwise  negative.  ____________________________________________   PHYSICAL EXAM:  VITAL SIGNS: ED Triage Vitals [10/23/16 1714]  Enc Vitals Group     BP 115/80     Pulse Rate 98     Resp 18     Temp 98.2 F (36.8 C)     Temp Source Oral     SpO2 98 %     Weight 105 lb (47.6 kg)     Height 5\' 1"  (1.549 m)     Head Circumference      Peak Flow      Pain Score      Pain Loc      Pain Edu?      Excl. in [place]?     Constitutional: Alert and oriented. Tearful, anxious Eyes: Conjunctivae are normal. PERRL. Normal extraocular movements. ENT   Head: Normocephalic and atraumatic.   Nose: No congestion/rhinnorhea.   Mouth/Throat: Mucous membranes are moist.   Neck: No stridor. Cardiovascular: Normal rate, regular rhythm. No murmurs, rubs, or gallops. Respiratory: Normal respiratory effort without tachypnea nor retractions. Breath sounds are clear and equal bilaterally. No wheezes/rales/rhonchi. Gastrointestinal: Soft and nontender. Normal bowel sounds Musculoskeletal: Nontender with normal range of motion in all extremities. No lower extremity tenderness nor edema. Neurologic:  Normal speech and language. No gross focal neurologic deficits are appreciated.  Skin:  Abrasion noted over the dorsum of the right arm distally Psychiatric: Depressed mood and affect, tearful ____________________________________________  ED COURSE:  Pertinent labs & imaging results that were available during my care of the patient were reviewed by me and considered in my medical decision making (see chart for details). Patient is no distress, we will assess with basic labs and consult psychiatry.  Procedures ____________________________________________   LABS (pertinent positives/negatives)  Labs Reviewed  COMPREHENSIVE METABOLIC PANEL - Abnormal; Notable for the following:       Result Value   Total Protein 8.3 (*)    Alkaline Phosphatase 32 (*)    All other components within normal limits   URINE DRUG SCREEN, QUALITATIVE ([facility] ONLY) - Abnormal; Notable for the following:    Benzodiazepine, Ur Scrn POSITIVE (*)    All other components within normal limits  CBC  ETHANOL  SALICYLATE LEVEL  ACETAMINOPHEN LEVEL  POC URINE PREG, ED  POCT PREGNANCY, URINE   ___________________________________________  FINAL ASSESSMENT AND PLAN  Depression, suicidal ideation  Plan: Patient with labs as dictated above. Patient is medically stable for psychiatric evaluation and disposition   Earleen Newport, MD  Patient was cleared for discharge by Dr. Weber Cooks. She is not felt to be an imminent threat to herself or others at this time. Note: This note was generated in part or whole with voice recognition software. Voice recognition is usually quite accurate but there are transcription errors that can and very often do occur. I apologize for any typographical errors that were not detected and corrected.     Earleen Newport, MD 10/23/16 1806    Earleen Newport, MD 10/23/16 Bosie Helper

## 2018-03-08 IMAGING — CT CT HEAD W/O CM
3 series · 16 of 45 positions shown, 19 images · non-contrast
Comparison: Head CT 06/18/2015

CLINICAL DATA: Right-sided headache.  Chest pain.

EXAM:
CT HEAD WITHOUT CONTRAST
TECHNIQUE: Contiguous axial images were obtained from the base of the skull
through the vertex without intravenous contrast.

[Series 3: ax head wo · axial · 0.38mm/px · z∈[-36,+77]mm · 10 of 28 slices shown, 13 images]
[im 3/28  brain]
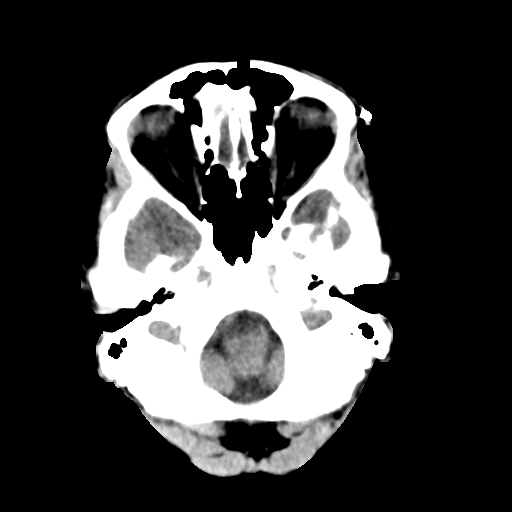
[im 3/28  bone]
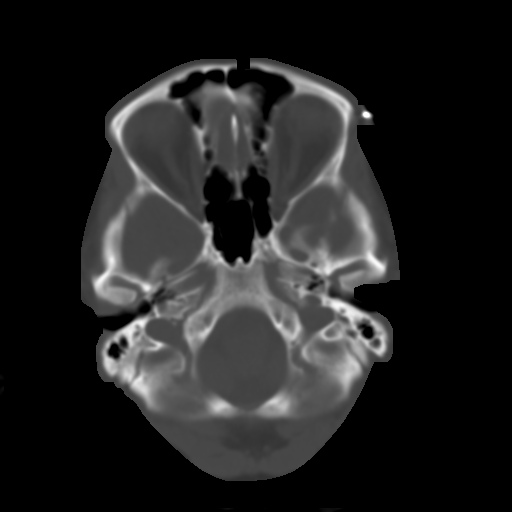
[im 5/28  brain]
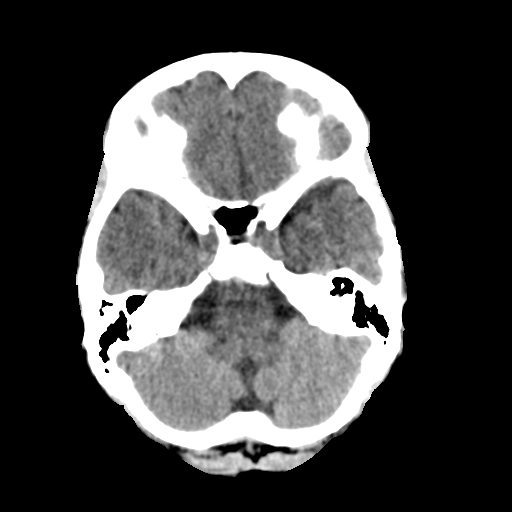
[im 8/28  brain]
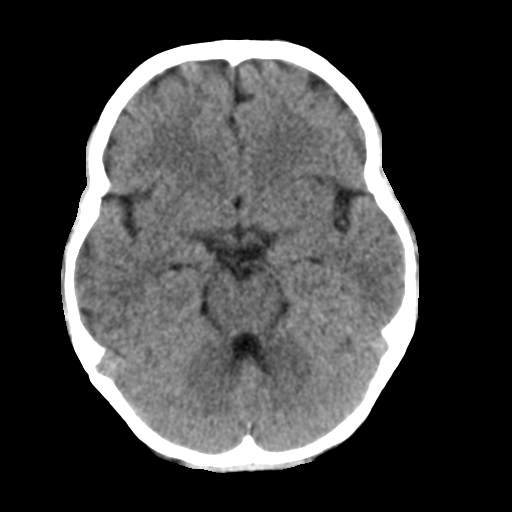
[im 11/28  brain]
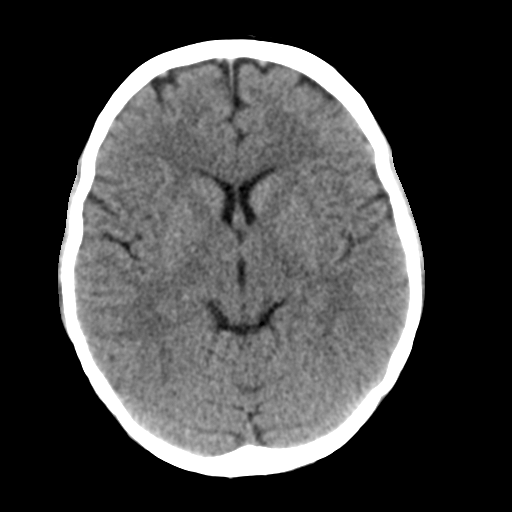
[im 13/28  brain]
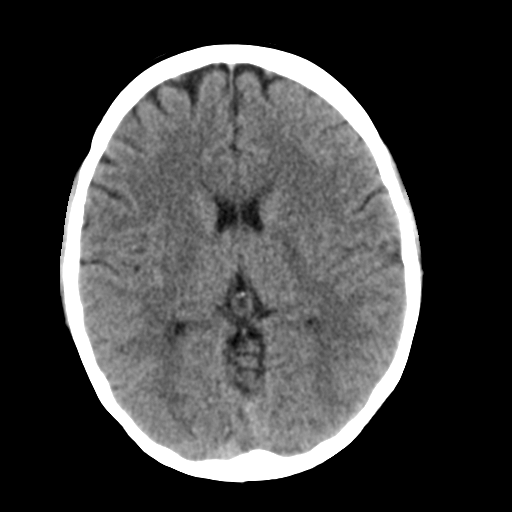
[im 13/28  bone]
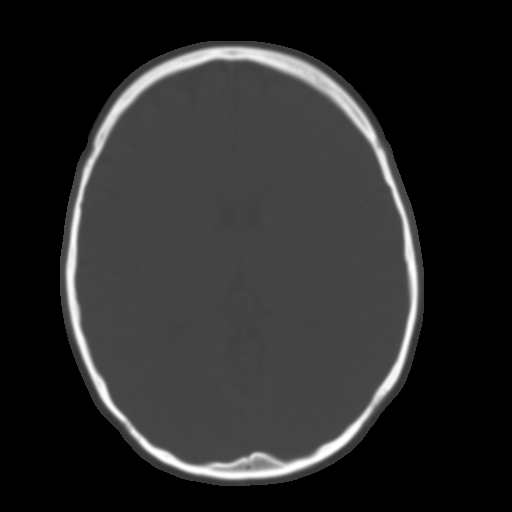
[im 16/28  brain]
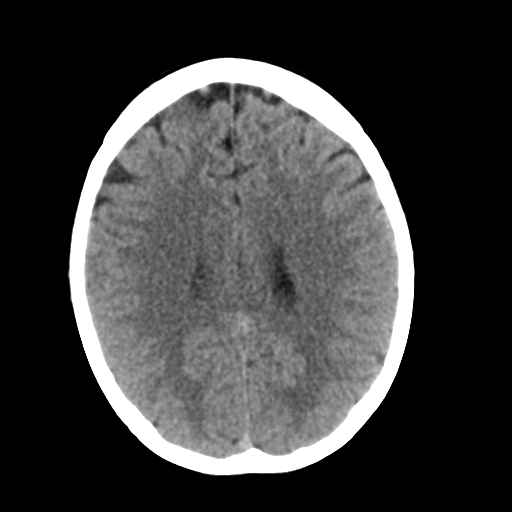
[im 18/28  brain]
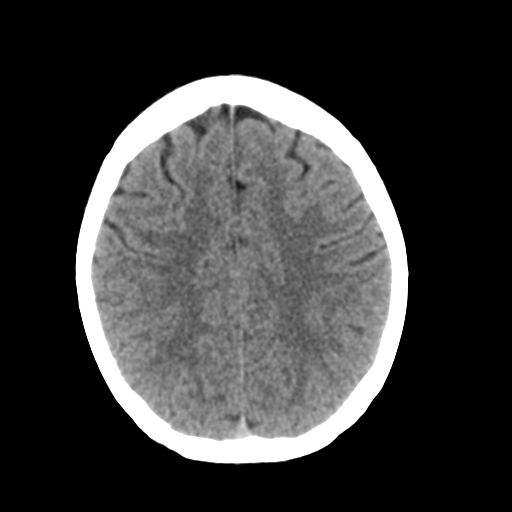
[im 21/28  brain]
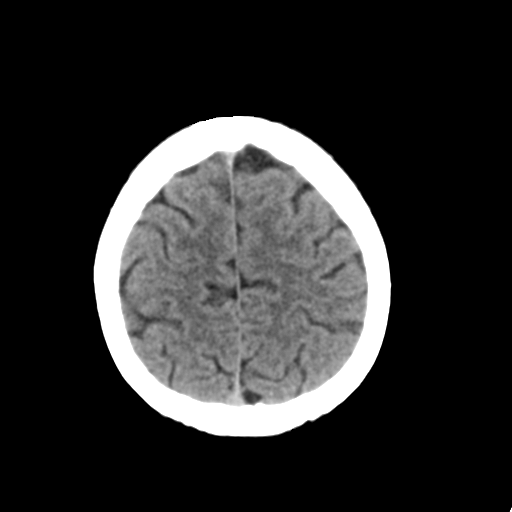
[im 24/28  brain]
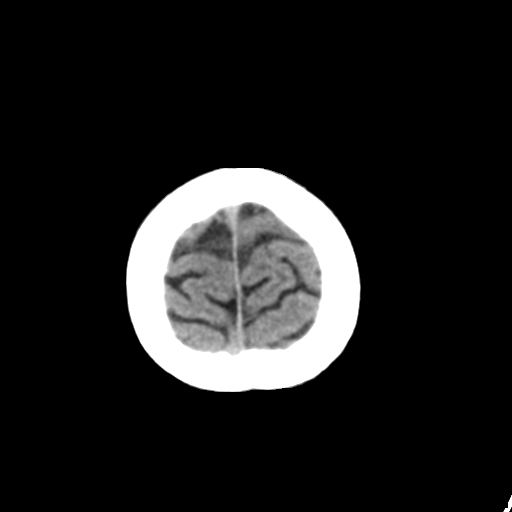
[im 24/28  bone]
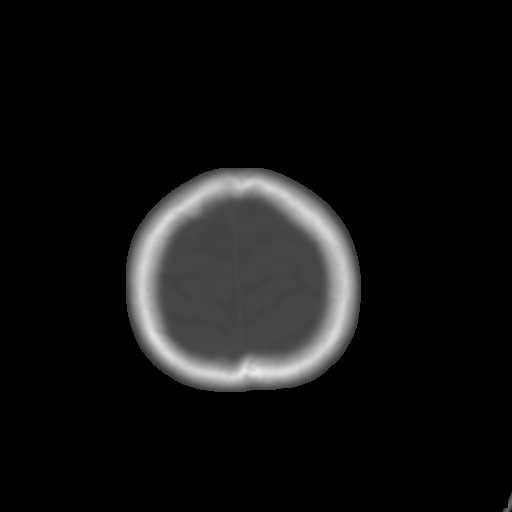
[im 26/28  brain]
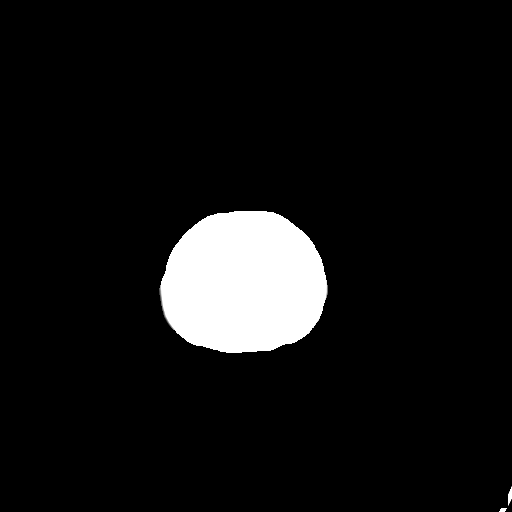

[Series 5: coronal soft tissue · coronal · 0.29mm/px · 3 of 59 slices shown]
[im 20/59  brain]
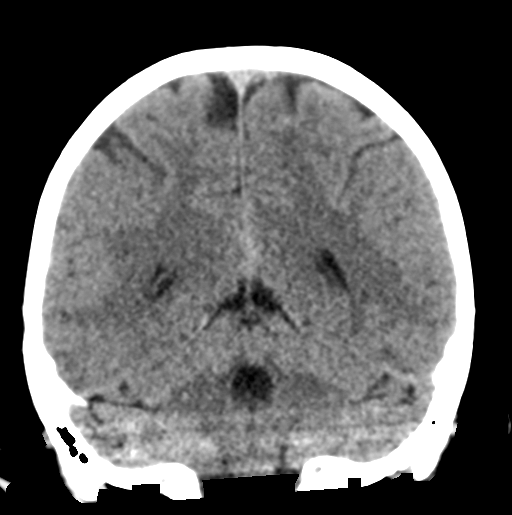
[im 26/59  brain]
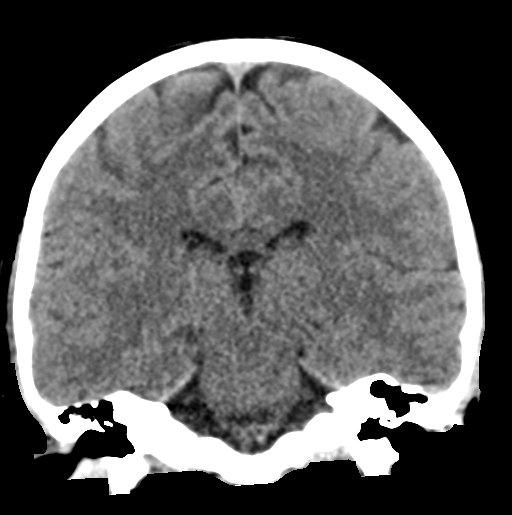
[im 33/59  brain]
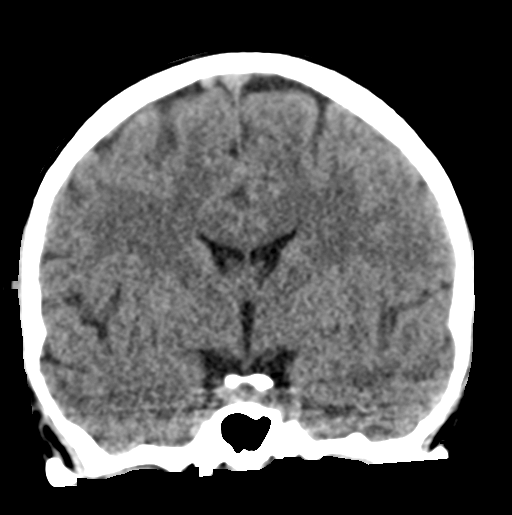

[Series 6: sagittal soft tissue · sagittal · 0.31mm/px · 3 of 48 slices shown]
[im 16/48  brain]
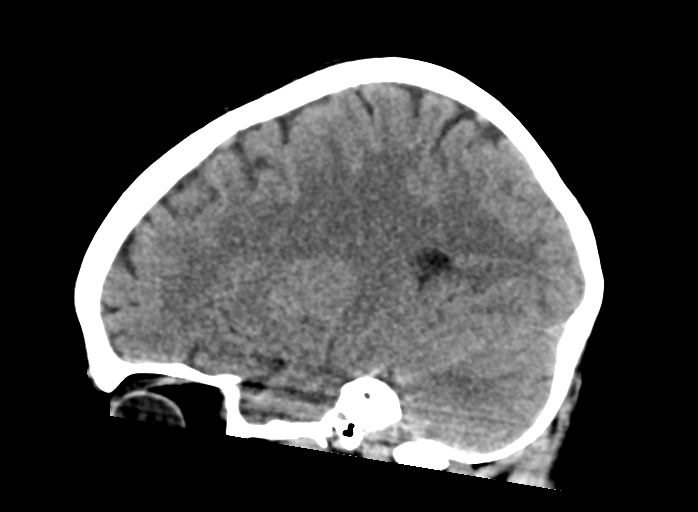
[im 24/48  brain]
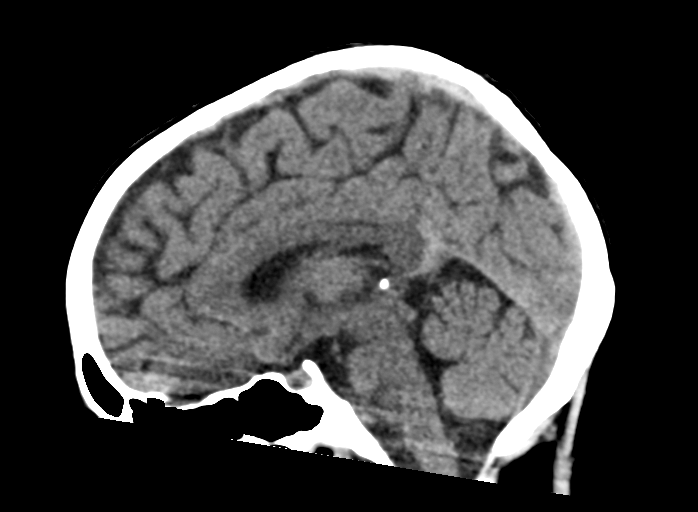
[im 32/48  brain]
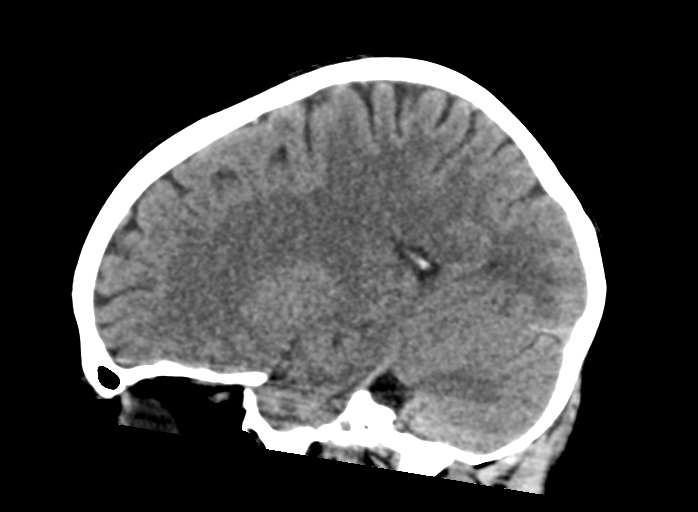

[16 of 45 positions shown; findings below may reference images not displayed]

FINDINGS: Brain: No evidence of acute infarction, hemorrhage, hydrocephalus,
extra-axial collection or mass lesion/mass effect.

Vascular: No hyperdense vessel or unexpected calcification.

Skull: Normal. Negative for fracture or focal lesion.

Sinuses/Orbits: No acute finding.

Other: None.
IMPRESSION: Normal noncontrast head CT.

## 2018-03-08 IMAGING — CR DG CHEST 2V
1 series · 2 of 2 positions shown · non-contrast
Comparison: None.

CLINICAL DATA: Acute onset of right-sided headache, nausea,
vomiting and generalized chest pain. Initial encounter.

EXAM:
CHEST  2 VIEW

[Series 1: dg chest 2 view · 0.14mm/px · 2 of 2 slices shown]
[im 1/2]
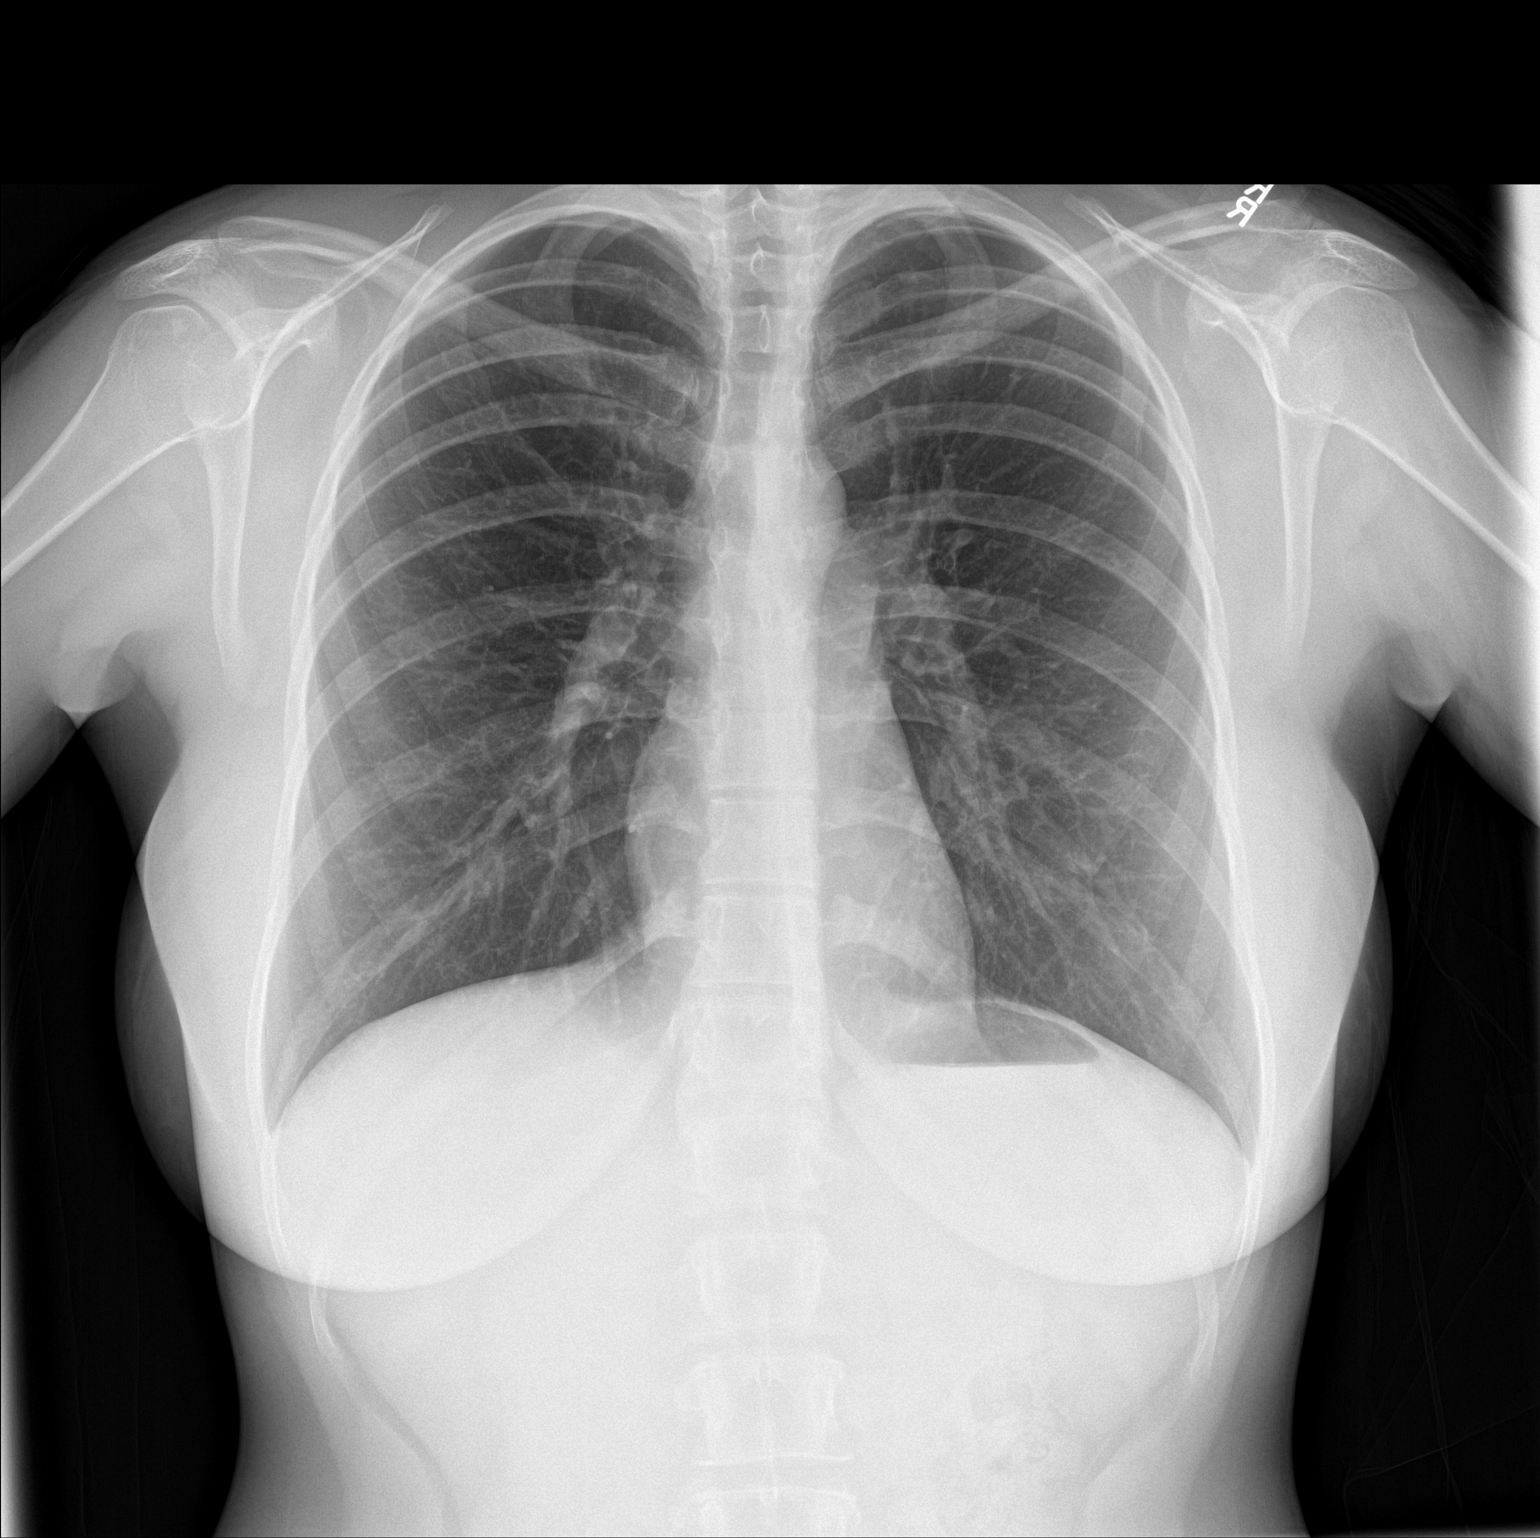
[im 2/2]
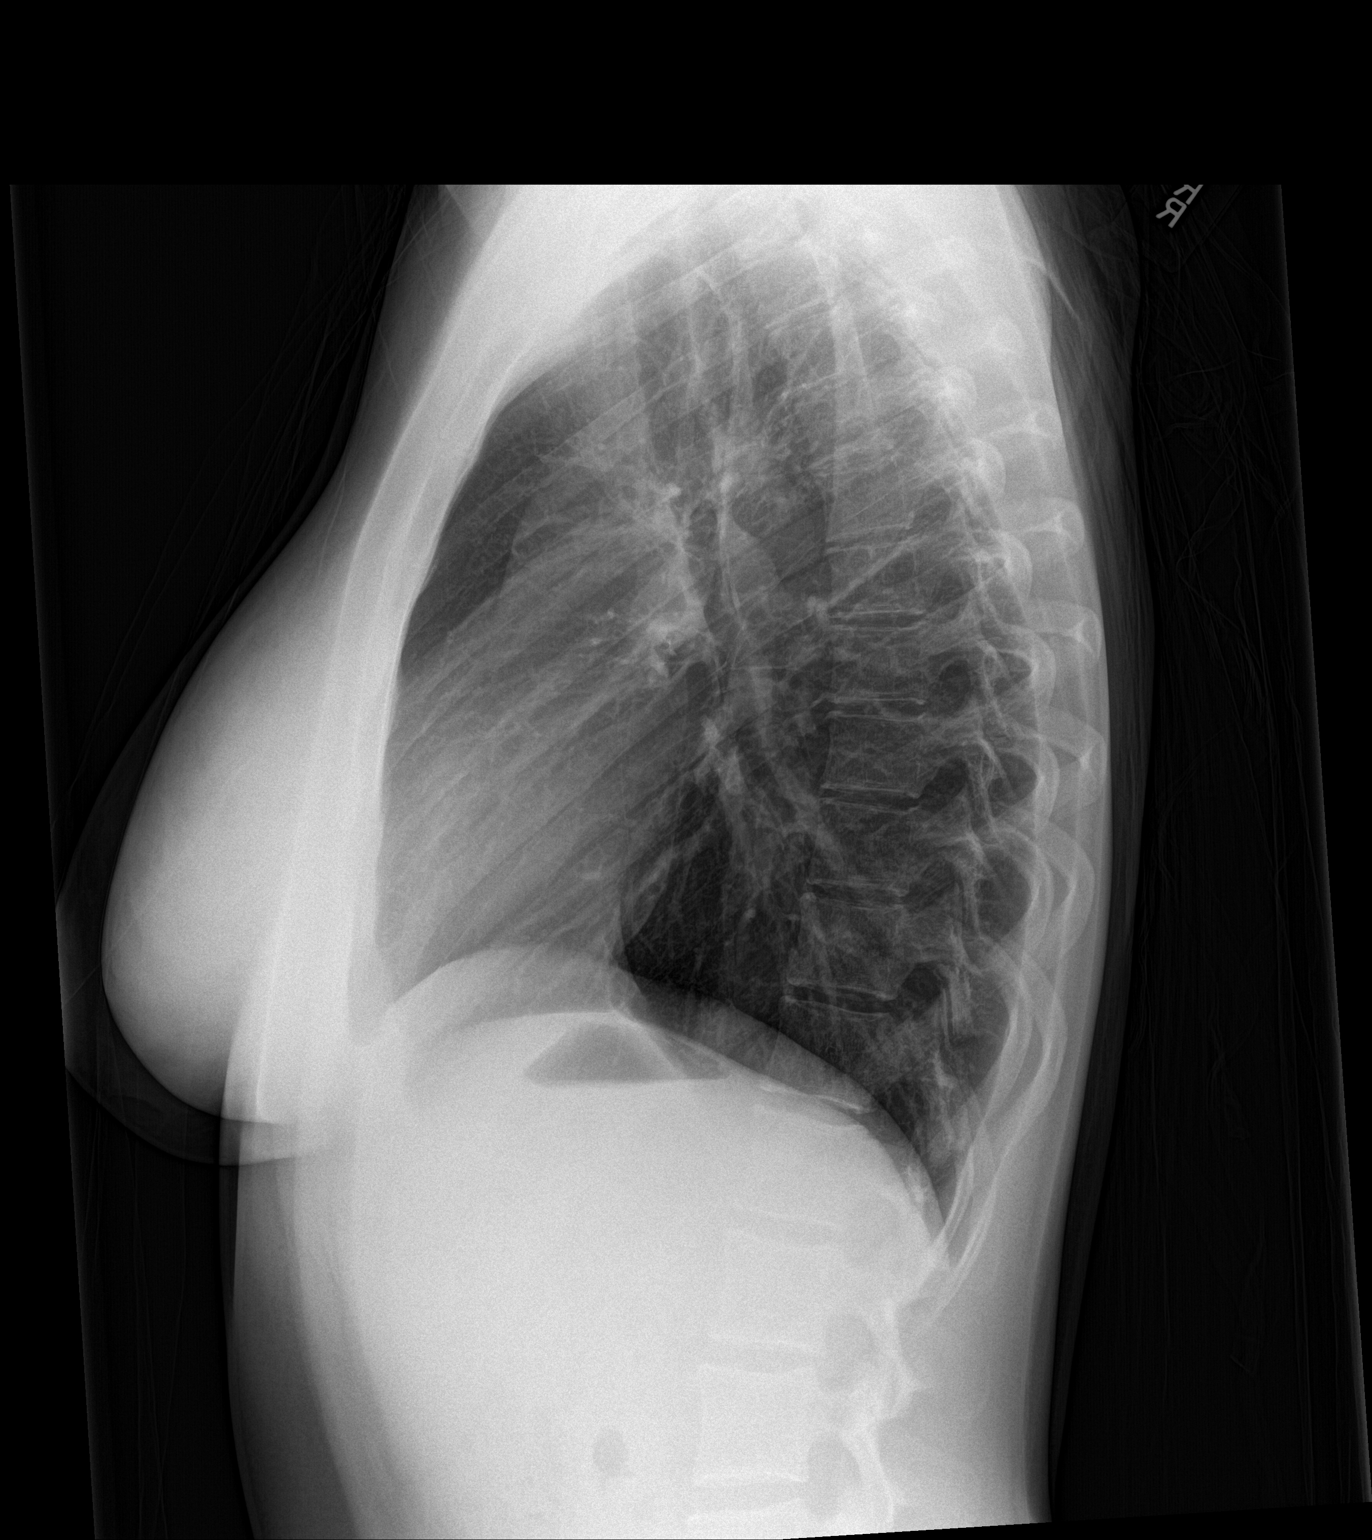

[2 of 2 positions shown; findings below may reference images not displayed]

FINDINGS: The lungs are well-aerated and clear. There is no evidence of focal
opacification, pleural effusion or pneumothorax.

The heart is normal in size; the mediastinal contour is within
normal limits. No acute osseous abnormalities are seen.
IMPRESSION: No acute cardiopulmonary process seen.
